# Patient Record
Sex: Female | Born: 1985 | Race: Black or African American | Hispanic: No | Marital: Single | Smoking: Never smoker
Health system: Southern US, Community
[De-identification: ages and names within clinical notes are randomized; demographics above are authoritative.]

## PROBLEM LIST (undated history)

## (undated) ENCOUNTER — Inpatient Hospital Stay (HOSPITAL_COMMUNITY): Payer: Self-pay

## (undated) DIAGNOSIS — I499 Cardiac arrhythmia, unspecified: Secondary | ICD-10-CM

## (undated) DIAGNOSIS — O26899 Other specified pregnancy related conditions, unspecified trimester: Secondary | ICD-10-CM

## (undated) DIAGNOSIS — R51 Headache: Secondary | ICD-10-CM

## (undated) DIAGNOSIS — D649 Anemia, unspecified: Secondary | ICD-10-CM

## (undated) DIAGNOSIS — R12 Heartburn: Secondary | ICD-10-CM

## (undated) DIAGNOSIS — R519 Headache, unspecified: Secondary | ICD-10-CM

## (undated) DIAGNOSIS — D219 Benign neoplasm of connective and other soft tissue, unspecified: Secondary | ICD-10-CM

---
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
## 2003-05-14 ENCOUNTER — Emergency Department (HOSPITAL_COMMUNITY): Admission: EM | Admit: 2003-05-14 | Discharge: 2003-05-15 | Payer: Self-pay | Admitting: *Deleted

## 2003-05-17 ENCOUNTER — Emergency Department (HOSPITAL_COMMUNITY): Admission: EM | Admit: 2003-05-17 | Discharge: 2003-05-17 | Payer: Self-pay | Admitting: Emergency Medicine

## 2006-11-19 ENCOUNTER — Emergency Department (HOSPITAL_COMMUNITY): Admission: EM | Admit: 2006-11-19 | Discharge: 2006-11-19 | Payer: Self-pay | Admitting: Emergency Medicine

## 2006-12-20 ENCOUNTER — Ambulatory Visit (HOSPITAL_COMMUNITY): Admission: RE | Admit: 2006-12-20 | Discharge: 2006-12-20 | Payer: Self-pay | Admitting: Obstetrics & Gynecology

## 2007-03-22 ENCOUNTER — Ambulatory Visit (HOSPITAL_COMMUNITY): Admission: RE | Admit: 2007-03-22 | Discharge: 2007-03-22 | Payer: Self-pay | Admitting: Obstetrics & Gynecology

## 2007-05-27 ENCOUNTER — Ambulatory Visit (HOSPITAL_COMMUNITY): Admission: RE | Admit: 2007-05-27 | Discharge: 2007-05-27 | Payer: Self-pay | Admitting: Obstetrics & Gynecology

## 2007-05-30 ENCOUNTER — Inpatient Hospital Stay (HOSPITAL_COMMUNITY): Admission: RE | Admit: 2007-05-30 | Discharge: 2007-06-02 | Payer: Self-pay | Admitting: Obstetrics & Gynecology

## 2010-11-08 NOTE — H&P (Signed)
Cynthia Tran, SEIER               ACCOUNT NO.:  [id number]   MEDICAL RECORD NO.:  [id number]          PATIENT TYPE:  INP   LOCATION:  NA                            FACILITY:  [facility]   PHYSICIAN:  Roseanna Rainbow, M.D.DATE OF BIRTH:  [birth year]   DATE OF ADMISSION:  DATE OF DISCHARGE:                              HISTORY & PHYSICAL   CHIEF COMPLAINT:  The patient is a 25 year old para 1 with an estimated  date of confinement of May 24, 2007 with an intrauterine pregnancy  at 40+ weeks, with the history of a previous cesarean delivery for an  elective repeat cesarean delivery.   HISTORY OF PRESENT ILLNESS:  Please see the above.   ALLERGIES:  No known drug allergies.   MEDICATIONS:  See please see the medication reconciliation form.   OBSTETRICAL RISK FACTORS:  1. Uterine fibroids.  2. Positive chlamydia DNA probe.  3. Urinary tract infection.  4. Previous cesarean section.   PAST OBSTETRICAL HISTORY:  In May of 2002, she was delivered of a live  born female, weight 7 pounds, at 42 weeks, via cesarean delivery.   PRENATAL SCREENINGS:  Chlamydia probe positive in June of 2008.  Test of  cure negative in September of 2008.  Urine culture and sensitivity in  June 2008 - E-coli; August 2008 insignificant growth and September  insignificant growth.  Platelet count 222,000.  RPR nonreactive, rubella  immune, sickle cell negative.  Blood type AB positive.  Antibody screen  negative.  GC probe negative.  One-hour GCT 92.  Hepatitis B surface  antigen negative.  Hematocrit 30.2, hemoglobin 10.3.  HIV nonreactive.  Sickle cell negative.  An ultrasound performed on March 22, 2007 at  31 weeks - estimated fetal weight percentile 34th percentile, normal  amniotic fluid volume, posterior placenta, no previa.   PAST GYNECOLOGIC HISTORY:  Noncontributory.   PAST MEDICAL HISTORY:  No significant history of medical diseases.   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   Does not give any  significant history of alcohol usage.  Has no significant smoking  history.   FAMILY HISTORY:  Hypertension.   PHYSICAL EXAMINATION:  VITAL SIGNS:  Stable, afebrile.  GENERAL:  Well-developed, well-nourished, in no apparent distress.  HEENT:  Normocephalic, atraumatic.  NECK:  Supple.  LUNGS:  Clear to auscultation bilaterally.  HEART:  Regular rate and rhythm.  ABDOMEN:  Gravid, nontender.  PELVIC:  Deferred.  EXTREMITIES:  Trace lower extremity edema.  SKIN:  Without rash.   ASSESSMENT:  Intrauterine pregnancy at term with a history of a previous  cesarean delivery.  Declines trial of labor.   PLAN:  The plan is admission and an elective repeat cesarean delivery.  The risks, benefits and alternative forms of management were reviewed  with the patient and informed consent had been obtained.      Roseanna Rainbow, M.D.  Electronically Signed     LAJ/MEDQ  D:  05/29/2007  T:  05/30/2007  Job:  [id number]

## 2010-11-08 NOTE — Op Note (Signed)
Cynthia Tran, Cynthia Tran               ACCOUNT NO.:  [id number]   MEDICAL RECORD NO.:  [id number]          PATIENT TYPE:  INP   LOCATION:  9116                          [facility]ACILITY:  [facility]   PHYSICIAN:  Roseanna Rainbow, M.D.DATE OF BIRTH:  01/18/1986   DATE OF PROCEDURE:  05/30/2007  DATE OF DISCHARGE:                               OPERATIVE REPORT   PREOPERATIVE DIAGNOSES:  1. Intrauterine pregnancy at term.  2. History of previous cesarean delivery.   POSTOPERATIVE DIAGNOSES:  1. Intrauterine pregnancy at term.  2. History of previous cesarean delivery.   PROCEDURE:  Repeat lower uterine flap elliptical cesarean delivery.   SURGEON:  Roseanna Rainbow, M.D.   ANESTHESIA:  Spinal.   ESTIMATED BLOOD LOSS:  600 mL.   COMPLICATIONS:  None.   PROCEDURE:  The patient was taken to the operating room with an IV  running.  A spinal anesthetic was then administered.  She was then  placed in the dorsal supine position with a leftward tilt and prepped  and draped in the usual sterile fashion.  After a time-out had been  completed, a Pfannenstiel skin incision was then made through the  previous scar and carried down to the underlying fascia.  The fascia was  nicked in the midline.  The fascial incision was then extended  bilaterally.  The superior aspect of the fascial incision was tented up  with Kocher clamps and the underlying rectus muscles dissected off.  The  inferior aspect of the fascial incision was manipulated in a similar  fashion.  The rectus muscles were separated in the midline.  The  parietal peritoneum was tented up and entered sharply.  This incision  was then extended superiorly and inferiorly with good visualization of  the bladder.  The bladder blade was then placed.  The vesicouterine  peritoneum was tented up and entered sharply.  This incision was then  extended bilaterally and the bladder flap created sharply.  The bladder  blade was then placed.  The lower  uterine segment was then incised in a  transverse fashion with the scalpel.  This incision was then extended  bluntly.  The infant's head was delivered atraumatically.  The cord was  clamped and cut.  The infant was handed off to the awaiting  neonatologist.  The placenta was then removed.  The intrauterine cavity  was evacuated of any remaining amniotic fluid, clots and debris with a  moist laparotomy sponge.  The cervix was then dilated.  The uterine  incision was then reapproximated in a running interlocking fashion with  0 Monocryl.  A second imbricating layer of the same was then placed.  The paracolic gutters were then irrigated.  The parietal peritoneum was  reapproximated in a running fashion using 2-0 Vicryl.  The fascia was  reapproximated in a running fashion using 0 PDS.  The skin was  closed in a subcuticular fashion using 3-0 Monocryl.  At the close of  the procedure, the instrument and pack counts were said to be correct  x2.  A gram of cephazolin had been given at cord clamp.  The patient was  taken to the PACU awake and in stable condition.      Roseanna Rainbow, M.D.  Electronically Signed     LAJ/MEDQ  D:  05/30/2007  T:  05/30/2007  Job:  [id number]

## 2010-11-11 NOTE — Discharge Summary (Signed)
NAMEKATRIEL, Cynthia Tran               ACCOUNT NO.:  [id number]   MEDICAL RECORD NO.:  [id number]          PATIENT TYPE:  INP   LOCATION:  9116                          [facility]ILITY[facility]  [facility]   PHYSICIAN:  Roseanna Rainbow, M.D.DATE OF BIRTH:  [birth year]   DATE OF ADMISSION:  05/30/2007  DATE OF DISCHARGE:  06/02/2007                               DISCHARGE SUMMARY   The patient is a 25 year old para 1 with an estimated date of  confinement of 05/24/2007 with an intrauterine pregnancy of 40+ weeks  with history of a previous cesarean delivery for an elective repeat  cesarean delivery.  Please see the dictated history and physical.   HOSPITAL COURSE:  The patient was admitted and underwent a repeat  cesarean delivery.  Please see the dictated operative summary.  On  postoperative day 1, her hemoglobin was 8.6 which was down from 9.8  preoperatively.  The remainder of her hospital course was uneventful.  She was discharged to home on postoperative day 3.   DISCHARGE DIAGNOSIS:  1. Intrauterine pregnancy at term.  2. History of previous cesarean delivery.   PROCEDURE:  Repeat cesarean delivery.   CONDITION:  Good.   DIET:  Regular.   ACTIVITY:  Pelvic rest.  Progressive activity.   MEDICATIONS:  Included Percocet, ibuprofen, Micronor.   DISPOSITION:  The patient was to follow up in the office in 2 weeks.      Roseanna Rainbow, M.D.  Electronically Signed     LAJ/MEDQ  D:  06/20/2007  T:  06/20/2007  Job:  [id number]

## 2010-11-11 NOTE — Discharge Summary (Signed)
[facility]  Patient:    Cynthia Tran, Cynthia Tran                        MRN: [id number] Adm. Date:  11/02/00 Disc. Date: 11/06/00 Dictator:   Jamey Reas, M.D.                           Discharge Summary  DATE OF BIRTH:                Mar 19, 1986  ADMISSION DIAGNOSES:          Term intrauterine pregnancy, active labor.  DISCHARGE DIAGNOSES:          Term intrauterine pregnancy, active labor, status post low transverse cesarean section for arrest of descent, persistent occiput posterior position, meconium.  CONSULTS:                     None.  PROCEDURE:                    Primary low transverse cesarean section on Nov 03, 2000 performed by Dr. Coral Ceo.  HOSPITAL COURSE:              A 25 year old G1, P1-0-0-1 at 42 6/7 admitted in active labor noted to have thick green meconium on artificial rupture of membranes.  Patient had failure to descend after two hours of pushing, persistent OP positioning.  Proceeded to cesarean section.  Patient had primary low transverse cesarean section of a viable female with Apgars of 5 at one minute and 9 at five minutes.  Patient did well postoperatively.  Routine postoperative course.  She is breast-feeding well.  She requests Depo at the time of discharge.  DISPOSITION:                  Discharged to home.  MEDICATIONS:                  Prenatal vitamins, Motrin, Percocet, iron sulfate.  DISCHARGE INSTRUCTIONS:       Routine postoperative instructions.  FOLLOW-UP:                    [place] [facility] in six weeks as routine followup. DD:  11/06/00 TD:  11/06/00 Job: [id number]

## 2010-11-11 NOTE — Op Note (Signed)
[facility]  Patient:    Cynthia Tran, Cynthia Tran                      MRN: [id number] Proc. Date: 11/03/00 Adm. Date:  [id number] Attending:  Antionette Char                           Operative Report  PREOPERATIVE DIAGNOSES:       1. Arrest of descent.                               2. Persistent occiput posterior position.                               3. Meconium.  POSTOPERATIVE DIAGNOSES:      1. Arrest of descent.                               2. Persistent occiput posterior position.                               3. Meconium.  PROCEDURE:                    Primary low transverse cesarean section.  SURGEON:                      Charles A. Clearance Coots, M.D.  ASSISTANT:                    Lysle Pearl, O.R.T.  ANESTHESIA:                   Epidural.  ESTIMATED BLOOD LOSS:         800 ml.  IV FLUIDS:                    400 ml.  URINE OUTPUT:                 100 ml clear.  COMPLICATIONS:                None.  DRAINS:                       Foley to gravity.  FINDINGS:                     A viable female at 50.  Apgars of 5 at one minute and 9 at five minutes.  Weight 7 lb.  Cord pH 7.24.  Normal uterus, ovaries and fallopian tubes.  DESCRIPTION OF PROCEDURE:     The patient was brought to the operating room after satisfactory redosing of the epidural.  The abdomen was prepped and draped in the usual sterile fashion.  A Pfannenstiel skin incision was made with a scalpel.  It was deepened down to the fascia with the scalpel.  The fascia was nicked in the midline and the facial incision was extended to the left and to the right with curved Mayo scissors.  The superior and inferiorly fascial edges were taken off of the rectus muscles with both blunt and sharp dissection.  The rectus muscle was then divided in the midline  superiorly and inferiorly, being careful to avoid the urinary bladder.  The peritoneum was entered digitally and was digitally  extended to the left and to the right.  A bladder blade was positioned.  The vesicouterine fold of the peritoneum above the reflection of the urinary bladder was grasped with forceps and was incised and undermined with Metzenbaum scissors.  The incision was extended to the left and to the right with Metzenbaum scissors.  A bladder flap was bluntly developed and the bladder blade was repositioned in front of the urinary bladder, placing it well out of the operative field.  The uterus was then entered transversely in the lower uterine segment with a scalpel.  A moderate amount of meconium-stained fluid was expelled.  The uterine incision was then extended laterally with digital traction.  The vertex was then delivered with the aid of fundal pressure from the assistant.  The infants mouth and nose were suctioned with DeLee suction and the delivery was then completed with the aid of fundal pressure from the assistant.  The umbilical cord was doubly clamped and cut and the infant was handed off to the nursery staff.  Cord pH and cord blood were obtained and the placenta was spontaneously expelled from the uterine cavity intact.  The uterus was exteriorized and the endometrial surface was thoroughly debrided with a dry laparotomy sponge.  The edges of the uterine incision were grasped with ring forceps and the uterus was closed with continuous interlocking suture of 0 Monocryl from each corner to the center.  Hemostasis was excellent.  The uterus was placed back in its normal anatomic position.  The pelvic cavity was thoroughly irrigated with warm saline solution.  All clots were removed.  Closure of the uterus was again observed for hemostasis and there was no active bleeding noted.  The abdomen was then closed as follows.  The fascia was closed with a continuous suture of 0 Panacryl.  The subcutaneous tissue was thoroughly irrigated with warm saline solution.  All areas of subcutaneous bleeding  were coagulated with the Bovie. The skin was then approximated with surgical stainless steel staples.  A sterile pressure bandage was applied to the incision closure.  The surgical technician indicated that all sponge, needle and instrument counts were correct.  The patient tolerated the procedure well and was transported to the recovery room in satisfactory condition. DD:  11/03/00 TD:  11/04/00 Job: [id number]

## 2010-11-11 NOTE — Discharge Summary (Signed)
[facility]  Patient:    Cynthia Tran, Cynthia Tran                        MRN: [id number] Adm. Date:  09/04/00 Disc. Date: 09/06/00 Dictator:   Marta Lamas, M.D.                           Discharge Summary  ADMISSION DIAGNOSES:          1. Intrauterine pregnancy at 33 weeks.                               2. Preterm contractions.                               3. Group B Streptococcal infection.  DISCHARGE DIAGNOSES:          1. Intrauterine pregnancy at 33 weeks.                               2. Preterm contractions.                               3. Group B Streptococcal infection.  PROCEDURE:                    None.  CONSULTING PHYSICIANS:        Social work consult.  HISTORY OF PRESENT ILLNESS:   The patient was called at home for gallbladder positive results and was told to come into triage for evaluation.  Upon arrival to triage, the patient had uterine contractions noted on the tocometer. Thus, the patient was evaluated and admitted for preter contractions with GBS positive results.  See H&P for details.  HOSPITAL COURSE:              Upon admission, the patient was started on magnesium sulfate for her uterine contractions.  Also the patient was started on Unasyn 3 grams IV q.6h.  Further, the patient was given betamethasone 12.5 mg IM x 2 doses for prematurity.  At the time of evaluation, the patient was also evaluated for other infection including GC and Chlamydia and cultures were sent and were negative during this hospital course.  Upon admission as well, the patients cervix was noted to be closed, thick, and high.  However, the patient continued to have uterine contractions.  The fetus had a reactive NST at the time.  The patient was started on magnesium sulfate after which her uterine contractions resolved.  Upon reexamination of cervix, the patients cervix was unchanged.  At that time magnesium was discontinued and she was started on oral  Terbutaline  and IV bolus of fluids. However on hospital day #2, the patients contractions increased in frequency and thus the patients Terbutaline was stopped and her magnesium sulfate was restarted.  However, at that time the patient had no cervical change from examination on admission.  After magnesium was started the second time, the patients uterine contractions resolved.  On hospital day #3, the patients magnesium was discontinued after no uterine contractions were noted.  Since discontinuation of the magnesium, the patient has had no other uterine activity.  Further there was concern for intrauterine growth retardation of  the fetus noted on the patients last ultrasound on August 14, 2000.  The ultrasound noted that the fetus was measuring in the 15th percentile and less than 10th percentile in weight using the Shephard and Hatlock criteria at 30 weeks. Ultrasound was repeated today and the fetal weight was within normal limits for gestational age.  The cervical length was also noted to be 3.5 cm which helped rule out preterm labor.  Thus, since the patient has had no further uterine activity and no cervical change during this hospital course and further, no signs of infection, we will discharge the patient home on modified bed rest.  We will continue the patient on ampicillin 500 mg four times a day for the next five days to have a total of seven days of antibiotics for her GBS positive results.  Further throughout hospital course, the fetus remained active during monitoring and further ultrasound proves good growth.  DISCHARGE MEDICATIONS:        Ampicillin 500 mg one tablet p.o. q.i.d. x 5 days.  DISCHARGE INSTRUCTIONS:       The patient is to be on modified bed rest and pelvic rest for the rest of the pregnancy.  FOLLOW-UP:                    The patient is to follow up at [facility] on March 21, at 8:30 p.m.  CONDITION ON DISCHARGE:       Stable.  DISPOSITION:                   Home with mother. DD:  09/06/00 TD:  09/06/00 Job: [id number]

## 2011-04-03 LAB — CBC
HCT: 24.4 — ABNORMAL LOW
HCT: 28.4 — ABNORMAL LOW
Hemoglobin: 8.6 — ABNORMAL LOW
MCHC: 35.2
MCV: 93.5
RBC: 3.03 — ABNORMAL LOW
RDW: 13
WBC: 8.4

## 2011-04-03 LAB — RPR: RPR Ser Ql: NONREACTIVE

## 2013-02-25 ENCOUNTER — Encounter (HOSPITAL_COMMUNITY): Payer: Self-pay | Admitting: Family Medicine

## 2013-02-25 ENCOUNTER — Emergency Department (HOSPITAL_COMMUNITY)
Admission: EM | Admit: 2013-02-25 | Discharge: 2013-02-26 | Disposition: A | Discharge status: Home / Self Care | Payer: Medicaid Other | Attending: Emergency Medicine | Admitting: Emergency Medicine

## 2013-02-25 DIAGNOSIS — R51 Headache: Secondary | ICD-10-CM | POA: Insufficient documentation

## 2013-02-25 DIAGNOSIS — H9209 Otalgia, unspecified ear: Secondary | ICD-10-CM | POA: Insufficient documentation

## 2013-02-25 DIAGNOSIS — K029 Dental caries, unspecified: Secondary | ICD-10-CM | POA: Insufficient documentation

## 2013-02-25 NOTE — ED Notes (Signed)
Patient states she has had a toothache since last Sunday. Has been using Orajel and it helped some with the tooth pain; now states she has right ear pain. Denies ear drainage or changes in hearing.

## 2013-02-26 MED ORDER — TRAMADOL HCL 50 MG PO TABS
50.0000 mg | ORAL_TABLET | Freq: Once | ORAL | Status: AC
  Administered 2013-02-26: 50 mg via ORAL
  Filled 2013-02-26: qty 1

## 2013-02-26 MED ORDER — TRAMADOL HCL 50 MG PO TABS: 50.0000 mg | ORAL_TABLET | Freq: Four times a day (QID) | ORAL | Status: DC | PRN

## 2013-02-26 MED ORDER — PENICILLIN V POTASSIUM 500 MG PO TABS: 500.0000 mg | ORAL_TABLET | Freq: Four times a day (QID) | ORAL | Status: DC

## 2013-02-26 MED ORDER — PENICILLIN V POTASSIUM 500 MG PO TABS
500.0000 mg | ORAL_TABLET | Freq: Once | ORAL | Status: AC
  Administered 2013-02-26: 500 mg via ORAL
  Filled 2013-02-26: qty 1

## 2013-02-26 NOTE — ED Provider Notes (Signed)
CSN: [id number]     Arrival date & time 02/25/13  2111 History   First MD Initiated Contact with Patient 02/26/13 0118     Chief Complaint  Patient presents with  . Otalgia  . Dental Pain   (Consider location/radiation/quality/duration/timing/severity/associated sxs/prior Treatment) HPI Comments: Patient reports, that she's had intermittent, right, ear pain, as well as a large cavity in her right lower molar intermittently.  It has bothered her for the past several months, worse over the last 3, days.  She's taken over-the-counter ibuprofen, applied.  Warm compresses, without relief of her discomfort.  She denies any fever, discharge from her ear   Patient is a 27 y.o. female presenting with ear pain and tooth pain. The history is provided by the patient.  Otalgia Location:  Right Quality:  Aching Severity:  No pain Onset quality:  Gradual Timing:  Intermittent Progression:  Worsening Chronicity:  Recurrent Context comment:  Large cavity, right lower second molar Relieved by:  Nothing Worsened by:  Cold air Ineffective treatments:  OTC medications Associated symptoms: headaches   Associated symptoms: no ear discharge, no fever, no hearing loss, no neck pain, no rash, no rhinorrhea, no sore throat and no tinnitus   Dental Pain Associated symptoms: headaches   Associated symptoms: no fever and no neck pain     History reviewed. No pertinent past medical history. Past Surgical History  Procedure Laterality Date  . Cesarean section     No family history on file. History  Substance Use Topics  . Smoking status: Never Smoker   . Smokeless tobacco: Not on file  . Alcohol Use: Yes     Comment: Occasional    OB History   Grav Para Term Preterm Abortions TAB SAB Ect Mult Living                 Review of Systems  Constitutional: Negative for fever.  HENT: Positive for ear pain. Negative for hearing loss, sore throat, rhinorrhea, neck pain, tinnitus and ear discharge.   Skin:  Negative for rash.  Neurological: Positive for headaches.  All other systems reviewed and are negative.    Allergies  Review of patient's allergies indicates no known allergies.  Home Medications   Current Outpatient Rx  Name  Route  Sig  Dispense  Refill  . benzocaine (ORAJEL) 10 % mucosal gel   Mouth/Throat   Use as directed in the mouth or throat as needed for pain.         . diphenhydramine-acetaminophen (TYLENOL PM) 25-500 MG TABS   Oral   Take 1 tablet by mouth at bedtime as needed.         . penicillin v potassium (VEETID) 500 MG tablet   Oral   Take 1 tablet (500 mg total) by mouth 4 (four) times daily.   39 tablet   0   . traMADol (ULTRAM) 50 MG tablet   Oral   Take 1 tablet (50 mg total) by mouth every 6 (six) hours as needed for pain.   30 tablet   0    BP 148/82  Pulse 93  Temp(Src) 98.5 F (36.9 C) (Oral)  Resp 16  SpO2 100%  LMP 02/16/2013 Physical Exam  Nursing note and vitals reviewed. Constitutional: She appears well-developed and well-nourished.  HENT:  Head: Normocephalic and atraumatic.  Right Ear: External ear normal.  Left Ear: External ear normal.  Mouth/Throat:    Eyes: Pupils are equal, round, and reactive to light.  Neck: Normal  range of motion.  Cardiovascular: Normal rate and regular rhythm.   Pulmonary/Chest: Effort normal and breath sounds normal.  Musculoskeletal: Normal range of motion.  Lymphadenopathy:    She has no cervical adenopathy.  Neurological: She is alert.  Skin: Skin is warm and dry.    ED Course  Procedures (including critical care time) Labs Review Labs Reviewed - No data to display Imaging Review No results found.  MDM   1. Dental caries     Patient will be treated with Pen-Vee K Ultram.  She has been referred to the free dental clinic, which revealed to [facility], September 26 and 27th    Arman Filter, NP 02/26/13 0145

## 2013-02-26 NOTE — ED Provider Notes (Signed)
Medical screening examination/treatment/procedure(s) were performed by non-physician practitioner and as supervising physician I was immediately available for consultation/collaboration.   Suzi Roots, MD 02/26/13 [phone]

## 2013-03-04 ENCOUNTER — Emergency Department (HOSPITAL_COMMUNITY)
Admission: EM | Admit: 2013-03-04 | Discharge: 2013-03-04 | Disposition: A | Discharge status: Home / Self Care | Payer: Medicaid Other | Attending: Emergency Medicine | Admitting: Emergency Medicine

## 2013-03-04 ENCOUNTER — Encounter (HOSPITAL_COMMUNITY): Payer: Self-pay | Admitting: Emergency Medicine

## 2013-03-04 DIAGNOSIS — K0889 Other specified disorders of teeth and supporting structures: Secondary | ICD-10-CM

## 2013-03-04 DIAGNOSIS — K089 Disorder of teeth and supporting structures, unspecified: Secondary | ICD-10-CM | POA: Insufficient documentation

## 2013-03-04 DIAGNOSIS — Z79899 Other long term (current) drug therapy: Secondary | ICD-10-CM | POA: Insufficient documentation

## 2013-03-04 MED ORDER — HYDROCODONE-ACETAMINOPHEN 5-325 MG PO TABS: 1.0000 | ORAL_TABLET | Freq: Four times a day (QID) | ORAL | Status: DC | PRN

## 2013-03-04 NOTE — ED Provider Notes (Signed)
CSN: [id number]     Arrival date & time 03/04/13  1622 History  This chart was scribed for non-physician practitioner Roxy Horseman, PA-C working with Gavin Pound. Oletta Lamas, MD by Danella Maiers, ED Scribe. This patient was seen in room TR05C/TR05C and the patient's care was started at 4:33 PM.    Chief Complaint  Patient presents with  . Dental Pain   The history is provided by the patient. No language interpreter was used.   HPI Comments: Cynthia Tran is a 27 y.o. female who presents to the Emergency Department complaining of constant bilateral dental pain onset 2 and a half weeks ago. She has right upper jaw pain that radiates to her temple and a broken tooth on her left upper jaw. She was seen on 02/25/13 for the right upper jaw pain and told she had an infection. She was given antibiotics and pain meds. She is still taking the antibiotics but is not taking the pain meds because she said they do not help and they make her stomach hurt. She reports inability to sleep and eat because of the pain. She does not have dental insurance.    History reviewed. No pertinent past medical history. Past Surgical History  Procedure Laterality Date  . Cesarean section     History reviewed. No pertinent family history. History  Substance Use Topics  . Smoking status: Never Smoker   . Smokeless tobacco: Not on file  . Alcohol Use: Yes     Comment: Occasional    OB History   Grav Para Term Preterm Abortions TAB SAB Ect Mult Living                 Review of Systems  All other systems reviewed and are negative.    Allergies  Review of patient's allergies indicates no known allergies.  Home Medications   Current Outpatient Rx  Name  Route  Sig  Dispense  Refill  . benzocaine (ORAJEL) 10 % mucosal gel   Mouth/Throat   Use as directed in the mouth or throat as needed for pain.         . diphenhydramine-acetaminophen (TYLENOL PM) 25-500 MG TABS   Oral   Take 1 tablet by mouth at bedtime as  needed.         . penicillin v potassium (VEETID) 500 MG tablet   Oral   Take 1 tablet (500 mg total) by mouth 4 (four) times daily.   39 tablet   0   . traMADol (ULTRAM) 50 MG tablet   Oral   Take 1 tablet (50 mg total) by mouth every 6 (six) hours as needed for pain.   30 tablet   0    BP 147/80  Pulse 86  Temp(Src) 98.2 F (36.8 C) (Oral)  Resp 18  SpO2 100%  LMP 02/16/2013 Physical Exam  Nursing note and vitals reviewed. Constitutional: She is oriented to person, place, and time. She appears well-developed and well-nourished. No distress.  HENT:  Head: Normocephalic and atraumatic.  Mouth/Throat:    Poor dentition throughout.  Affected tooth as diagrammed.  No signs of peritonsillar or tonsillar abscess.  No signs of gingival abscess. Oropharynx is clear and without exudates.  Uvula is midline.  Airway is intact. No signs of Ludwig's angina.   Eyes: EOM are normal.  Neck: Neck supple. No tracheal deviation present.  Cardiovascular: Normal rate.   Pulmonary/Chest: Effort normal. No respiratory distress.  Musculoskeletal: Normal range of motion.  Neurological: She  is alert and oriented to person, place, and time.  Skin: Skin is warm and dry.  Psychiatric: She has a normal mood and affect. Her behavior is normal.    ED Course  Dental Date/Time: 03/04/2013 4:59 PM Performed by: Roxy Horseman Authorized by: Roxy Horseman Consent: Verbal consent obtained. Risks and benefits: risks, benefits and alternatives were discussed Consent given by: patient Patient understanding: patient states understanding of the procedure being performed Patient consent: the patient's understanding of the procedure matches consent given Procedure consent: procedure consent matches procedure scheduled Relevant documents: relevant documents present and verified Test results: test results available and properly labeled Site marked: the operative site was marked Imaging studies:  imaging studies available Required items: required blood products, implants, devices, and special equipment available Patient identity confirmed: verbally with patient Time out: Immediately prior to procedure a "time out" was called to verify the correct patient, procedure, equipment, support staff and site/side marked as required. Preparation: Patient was prepped and draped in the usual sterile fashion. Local anesthesia used: yes Anesthesia: local infiltration Local anesthetic: bupivacaine 0.25% with epinephrine Anesthetic total: 1.8 ml Patient sedated: no Patient tolerance: Patient tolerated the procedure well with no immediate complications.   (including critical care time) Medications - No data to display  DIAGNOSTIC STUDIES: Oxygen Saturation is 100% on room air, normal by my interpretation.    COORDINATION OF CARE: 4:51 PM- Discussed treatment plan with pt which includes a numbing shot and a dentist referral and pt agrees to plan.    Labs Review Labs Reviewed - No data to display Imaging Review No results found.  MDM   1. Pain, dental    Patient with toothache.  No gross abscess.  Exam unconcerning for Ludwig's angina or spread of infection.  Will treat with penicillin and pain medicine.  Urged patient to follow-up with dentist.    I personally performed the services described in this documentation, which was scribed in my presence. The recorded information has been reviewed and is accurate.           Roxy Horseman, PA-C 03/04/13 1700

## 2013-03-04 NOTE — ED Notes (Signed)
Pt c/o bilateral dental pain

## 2013-03-06 NOTE — ED Provider Notes (Signed)
Medical screening examination/treatment/procedure(s) were performed by non-physician practitioner and as supervising physician I was immediately available for consultation/collaboration.  Gavin Pound. Oletta Lamas, MD 03/06/13 2157

## 2013-03-16 ENCOUNTER — Emergency Department (HOSPITAL_COMMUNITY)
Admission: EM | Admit: 2013-03-16 | Discharge: 2013-03-16 | Disposition: A | Discharge status: Home / Self Care | Payer: Medicaid Other | Attending: Emergency Medicine | Admitting: Emergency Medicine

## 2013-03-16 ENCOUNTER — Encounter (HOSPITAL_COMMUNITY): Payer: Self-pay | Admitting: Emergency Medicine

## 2013-03-16 DIAGNOSIS — K089 Disorder of teeth and supporting structures, unspecified: Secondary | ICD-10-CM | POA: Insufficient documentation

## 2013-03-16 DIAGNOSIS — K029 Dental caries, unspecified: Secondary | ICD-10-CM

## 2013-03-16 MED ORDER — IBUPROFEN 600 MG PO TABS: 600.0000 mg | ORAL_TABLET | Freq: Four times a day (QID) | ORAL | Status: DC | PRN

## 2013-03-16 MED ORDER — HYDROCODONE-ACETAMINOPHEN 5-325 MG PO TABS
2.0000 | ORAL_TABLET | Freq: Once | ORAL | Status: AC
  Administered 2013-03-16: 2 via ORAL
  Filled 2013-03-16: qty 2

## 2013-03-16 MED ORDER — HYDROCODONE-ACETAMINOPHEN 5-325 MG PO TABS: ORAL_TABLET | ORAL | Status: DC

## 2013-03-16 MED ORDER — CLINDAMYCIN HCL 150 MG PO CAPS: 300.0000 mg | ORAL_CAPSULE | Freq: Three times a day (TID) | ORAL | Status: DC

## 2013-03-16 NOTE — ED Notes (Signed)
Pt c/o right ear pain x 3 days

## 2013-03-16 NOTE — ED Provider Notes (Signed)
Medical screening examination/treatment/procedure(s) were performed by non-physician practitioner and as supervising physician I was immediately available for consultation/collaboration.   Shanna Cisco, MD 03/16/13 [phone]

## 2013-03-16 NOTE — ED Provider Notes (Signed)
CSN: [id number]     Arrival date & time 03/16/13  1053 History  This chart was scribed for non-physician practitioner working Junius Finner, PA-C, with Shanna Cisco, MD by Dorothey Baseman, ED Scribe. This patient was seen in room TR09C/TR09C and the patient's care was started at 12:11 PM.    Chief Complaint  Patient presents with  . Otalgia   The history is provided by the patient. No language interpreter was used.   HPI Comments: Cynthia Tran is a 27 y.o. female who presents to the Emergency Department complaining of a shooting right ear pain that radiates into the right jaw with some associated tongue swelling onset 1 week ago. She states that the ear pain appeared to be getting better last week, but has since been progressively worsening. She states that she was seen here for dental problems about 1 month ago and was given Penicillin and she reports that she finished the course 9 days ago. She reports that she has taken Tylenol PM and Vicodin at home without relief. She denies fever, nausea, vomiting, difficulty swallowing, or difficulty breathing. Patient reports that she was referred to a dentist, but has not been able to follow up.  History reviewed. No pertinent past medical history. Past Surgical History  Procedure Laterality Date  . Cesarean section     History reviewed. No pertinent family history. History  Substance Use Topics  . Smoking status: Never Smoker   . Smokeless tobacco: Not on file  . Alcohol Use: Yes     Comment: Occasional    OB History   Grav Para Term Preterm Abortions TAB SAB Ect Mult Living                 Review of Systems  Constitutional: Negative for fever.  HENT: Positive for ear pain and dental problem. Negative for trouble swallowing.   Respiratory: Negative for shortness of breath.   Gastrointestinal: Negative for nausea and vomiting.  All other systems reviewed and are negative.    Allergies  Percocet  Home Medications   Current  Outpatient Rx  Name  Route  Sig  Dispense  Refill  . clindamycin (CLEOCIN) 150 MG capsule   Oral   Take 2 capsules (300 mg total) by mouth 3 (three) times daily. May dispense as 150mg  capsules   60 capsule   0   . diphenhydramine-acetaminophen (TYLENOL PM) 25-500 MG TABS   Oral   Take 1 tablet by mouth at bedtime as needed. For pain/sleep         . HYDROcodone-acetaminophen (NORCO/VICODIN) 5-325 MG per tablet   Oral   Take 1 tablet by mouth every 6 (six) hours as needed for pain.   7 tablet   0   . HYDROcodone-acetaminophen (NORCO/VICODIN) 5-325 MG per tablet      Take 1-2 pills every 4-6 hours as needed for pain   6 tablet   0   . ibuprofen (ADVIL,MOTRIN) 600 MG tablet   Oral   Take 1 tablet (600 mg total) by mouth every 6 (six) hours as needed for pain.   30 tablet   0   . penicillin v potassium (VEETID) 500 MG tablet   Oral   Take 1 tablet (500 mg total) by mouth 4 (four) times daily.   39 tablet   0   . traMADol (ULTRAM) 50 MG tablet   Oral   Take 1 tablet (50 mg total) by mouth every 6 (six) hours as needed for pain.  30 tablet   0    Triage Vitals; BP 153/84  Pulse 91  Temp(Src) 98.2 F (36.8 C) (Oral)  Resp 18  Ht 5\' 2"  (1.575 m)  SpO2 100%  LMP 02/16/2013  Physical Exam  Nursing note and vitals reviewed. Constitutional: She is oriented to person, place, and time. She appears well-developed and well-nourished.  HENT:  Head: Normocephalic and atraumatic.  Right Ear: Hearing, tympanic membrane, external ear and ear canal normal.  Left Ear: Hearing, tympanic membrane, external ear and ear canal normal.  Mouth/Throat: Oropharynx is clear and moist.  No dental abscess appreciated.   Eyes: EOM are normal.  Neck: Normal range of motion.  Cardiovascular: Normal rate.   Pulmonary/Chest: Effort normal.  Musculoskeletal: Normal range of motion.  Neurological: She is alert and oriented to person, place, and time.  Skin: Skin is warm and dry.   Psychiatric: She has a normal mood and affect. Her behavior is normal.    ED Course  Procedures (including critical care time)  DIAGNOSTIC STUDIES: Oxygen Saturation is 100% on room air, normal by my interpretation.    COORDINATION OF CARE: 12:20PM- Will discharge patient with antibiotics and pain medication to manage pain symptoms. Advised patient to attend the free dental clinic. Advised patient to return to the ED if there are any new or worsening symptoms, especially difficulty swallowing or facial swelling. Discussed treatment plan with patient at bedside and patient verbalized agreement.     Labs Review Labs Reviewed - No data to display Imaging Review No results found.  MDM   1. Pain due to dental caries     I personally performed the services described in this documentation, which was scribed in my presence. The recorded information has been reviewed and is accurate.    Junius Finner, PA-C 03/16/13 [phone]

## 2013-06-26 DIAGNOSIS — I499 Cardiac arrhythmia, unspecified: Secondary | ICD-10-CM

## 2013-06-26 HISTORY — DX: Cardiac arrhythmia, unspecified: I49.9

## 2013-10-28 ENCOUNTER — Encounter (HOSPITAL_COMMUNITY): Payer: Self-pay | Admitting: *Deleted

## 2013-10-28 ENCOUNTER — Inpatient Hospital Stay (HOSPITAL_COMMUNITY)
Admission: AD | Admit: 2013-10-28 | Discharge: 2013-10-28 | Disposition: A | Discharge status: Home / Self Care | Payer: Self-pay | Source: Ambulatory Visit | Attending: Obstetrics & Gynecology | Admitting: Obstetrics & Gynecology

## 2013-10-28 DIAGNOSIS — Z3201 Encounter for pregnancy test, result positive: Secondary | ICD-10-CM

## 2013-10-28 HISTORY — DX: Benign neoplasm of connective and other soft tissue, unspecified: D21.9

## 2013-10-28 LAB — POCT PREGNANCY, URINE: PREG TEST UR: POSITIVE — AB

## 2013-10-28 NOTE — MAU Provider Note (Signed)
Attestation of Attending Supervision of Advanced Practitioner (CNM/NP): Evaluation and management procedures were performed by the Advanced Practitioner under my supervision and collaboration.  I have reviewed the Advanced Practitioner's note and chart, and I agree with the management and plan.  Billey Wojciak Harraway-Smith 4:02 PM

## 2013-10-28 NOTE — Discharge Instructions (Signed)
Prenatal Care  °WHAT IS PRENATAL CARE?  °Prenatal care means health care during your pregnancy, before your baby is born. It is very important to take care of yourself and your baby during your pregnancy by:  °· Getting early prenatal care. If you know you are pregnant, or think you might be pregnant, call your health care provider as soon as possible. Schedule a visit for a prenatal exam. °· Getting regular prenatal care. Follow your health care provider's schedule for blood and other necessary tests. Do not miss appointments. °· Doing everything you can to keep yourself and your baby healthy during your pregnancy. °· Getting complete care. Prenatal care should include evaluation of the medical, dietary, educational, psychological, and social needs of you and your significant other. The medical and genetic history of your family and the family of your baby's father should be discussed with your health care provider. °· Discussing with your health care provider: °· Prescription, over-the-counter, and herbal medicines that you take. °· Any history of substance abuse, alcohol use, smoking, and illegal drug use. °· Any history of domestic abuse and violence. °· Immunizations you have received. °· Your nutrition and diet. °· The amount of exercise you do. °· Any environmental and occupational hazards to which you are exposed. °· History of sexually transmitted infections for both you and your partner. °· Previous pregnancies you have had. °WHY IS PRENATAL CARE SO IMPORTANT?  °By regularly seeing your health care provider, you help ensure that problems can be identified early so that they can be treated as soon as possible. Other problems might be prevented. Many studies have shown that early and regular prenatal care is important for the health of mothers and their babies.  °HOW CAN I TAKE CARE OF MYSELF WHILE I AM PREGNANT?  °Here are ways to take care of yourself and your baby:  °· Start or continue taking your  multivitamin with 400 micrograms (mcg) of folic acid every day. °· Get early and regular prenatal care. It is very important to see a health care provider during your pregnancy. Your health care provider will check at each visit to make sure that you and the baby are healthy. If there are any problems, action can be taken right away to help you and the baby. °· Eat a healthy diet that includes: °· Fruits. °· Vegetables. °· Foods low in saturated fat. °· Whole grains. °· Calcium-rich foods, such as milk, yogurt, and hard cheeses. °· Drink 6 to 8 glasses of liquids a day. °· Unless your health care provider tells you not to, try to be physically active for 30 minutes, most days of the week. If you are pressed for time, you can get your activity in through 10-minute segments, three times a day. °· Do not smoke, drink alcohol, or use drugs. These can cause long-term damage to your baby. Talk with your health care provider about steps to take to stop smoking. Talk with a member of your faith community, a counselor, a trusted friend, or your health care provider if you are concerned about your alcohol or drug use. °· Ask your health care provider before taking any medicine, even over-the-counter medicines. Some medicines are not safe to take during pregnancy. °· Get plenty of rest and sleep. °· Avoid hot tubs and saunas during pregnancy. °· Do not have X-rays taken unless absolutely necessary and with the recommendation of your health care provider. A lead shield can be placed on your abdomen to protect the   baby when X-rays are taken in other parts of the body. °· Do not empty the cat litter when you are pregnant. It may contain a parasite that causes an infection called toxoplasmosis, which can cause birth defects. Also, use gloves when working in garden areas used by cats. °· Do not eat uncooked or undercooked meats or fish. °· Do not eat soft, mold-ripened cheeses (Brie, Camembert, and chevre) or soft, blue-veined  cheese (Danish blue and Roquefort). °· Stay away from toxic chemicals like: °· Insecticides. °· Solvents (some cleaners or paint thinners). °· Lead. °· Mercury. °· Sexual intercourse may continue until the end of the pregnancy, unless you have a medical problem or there is a problem with the pregnancy and your health care provider tells you not to. °· Do not wear high-heel shoes, especially during the second half of the pregnancy. You can lose your balance and fall. °· Do not take long trips, unless absolutely necessary. Be sure to see your health care provider before going on the trip. °· Do not sit in one position for more than 2 hours when on a trip. °· Take a copy of your medical records when going on a trip. Know where a hospital is located in the city you are visiting, in case of an emergency. °· Most dangerous household products will have pregnancy warnings on their labels. Ask your health care provider about products if you are unsure. °· Limit or eliminate your caffeine intake from coffee, tea, sodas, medicines, and chocolate. °· Many women continue working through pregnancy. Staying active might help you stay healthier. If you have a question about the safety or the hours you work at your particular job, talk with your health care provider. °· Get informed: °· Read books. °· Watch videos. °· Go to childbirth classes for you and your significant other. °· Talk with experienced moms. °· Ask your health care provider about childbirth education classes for you and your partner. Classes can help you and your partner prepare for the birth of your baby. °· Ask about a baby doctor (pediatrician) and methods and pain medicine for labor, delivery, and possible cesarean delivery. °HOW OFTEN SHOULD I SEE MY HEALTH CARE PROVIDER DURING PREGNANCY?  °Your health care provider will give you a schedule for your prenatal visits. You will have visits more often as you get closer to the end of your pregnancy. An average  pregnancy lasts about 40 weeks.  °A typical schedule includes visiting your health care provider:  °· About once each month during your first 6 months of pregnancy. °· Every 2 weeks during the next 2 months. °· Weekly in the last month, until the delivery date. °Your health care provider will probably want to see you more often if: °· You are older than 35 years. °· Your pregnancy is high risk because you have certain health problems or problems with the pregnancy, such as: °· Diabetes. °· High blood pressure. °· The baby is not growing on schedule, according to the dates of the pregnancy. °Your health care provider will do special tests to make sure you and the baby are not having any serious problems. °WHAT HAPPENS DURING PRENATAL VISITS?  °· At your first prenatal visit, your health care provider will do a physical exam and talk to you about your health history and the health history of your partner and your family. Your health care provider will be able to tell you what date to expect your baby to be born on. °·   Your first physical exam will include checks of your blood pressure, measurements of your height and weight, and an exam of your pelvic organs. Your health care provider will do a Pap test if you have not had one recently and will do cultures of your cervix to make sure there is no infection. °· At each prenatal visit, there will be tests of your blood, urine, blood pressure, weight, and checking the progress of the baby. °· At your later prenatal visits, your health care provider will check how you are doing and how the baby is developing. You may have a number of tests done as your pregnancy progresses. °· Ultrasound exams are often used to check on the baby's growth and health. °· You may have more urine and blood tests, as well as special tests, if needed. These may include amniocentesis to examine fluid in the pregnancy sac, stress tests to check how the baby responds to contractions, or a  biophysical profile to measure fetus well-being. Your health care provider will explain the tests and why they are necessary. °· You should discuss with your health care provider your plans to breastfeed or bottle-feed your baby. °· Each visit is also a chance for you to learn about staying healthy during pregnancy and to ask questions. °Document Released: 06/15/2003 Document Revised: 04/02/2013 Document Reviewed: 11/28/2012 °[employer]® Patient Information ©2014 [employer]. ° °

## 2013-10-28 NOTE — MAU Note (Signed)
+  HPT 2-3 wks ago.  Called dr to start care, was instructed to come here for confirmation and to get [facility]. No complaints or problems.

## 2013-10-28 NOTE — MAU Provider Note (Signed)
History     CSN: [id number]  Arrival date and time: 10/28/13 1314   First Provider Initiated Contact with Patient 10/28/13 1338      Chief Complaint  Patient presents with  . Possible Pregnancy   Possible Pregnancy    Pt is a G3P2002 at wks [redacted]w[redacted]d IUP by LMP here with +HPT 2-3 wks ago. Called MD to start care instructed to come here for confirmation and to get [facility]. No complaints or problems.   Past Medical History  Diagnosis Date  . Preterm labor     contractions, no dilation  . Fibroid     Past Surgical History  Procedure Laterality Date  . Cesarean section      Family History  Problem Relation Age of Onset  . Hypertension Mother   . Diabetes Maternal Grandmother   . Hypertension Maternal Grandmother   . Heart disease Maternal Grandmother     3 heart attacks    History  Substance Use Topics  . Smoking status: Never Smoker   . Smokeless tobacco: Never Used  . Alcohol Use: No     Comment: Occasional     Allergies:  Allergies  Allergen Reactions  . Percocet [Oxycodone-Acetaminophen] Other (See Comments)    Blisters on face    Prescriptions prior to admission  Medication Sig Dispense Refill  . clindamycin (CLEOCIN) 150 MG capsule Take 2 capsules (300 mg total) by mouth 3 (three) times daily. May dispense as 150mg  capsules  60 capsule  0  . diphenhydramine-acetaminophen (TYLENOL PM) 25-500 MG TABS Take 1 tablet by mouth at bedtime as needed. For pain/sleep      . HYDROcodone-acetaminophen (NORCO/VICODIN) 5-325 MG per tablet Take 1 tablet by mouth every 6 (six) hours as needed for pain.  7 tablet  0  . HYDROcodone-acetaminophen (NORCO/VICODIN) 5-325 MG per tablet Take 1-2 pills every 4-6 hours as needed for pain  6 tablet  0  . ibuprofen (ADVIL,MOTRIN) 600 MG tablet Take 1 tablet (600 mg total) by mouth every 6 (six) hours as needed for pain.  30 tablet  0  . penicillin v potassium (VEETID) 500 MG tablet Take 1 tablet (500 mg total) by mouth 4 (four) times  daily.  39 tablet  0  . traMADol (ULTRAM) 50 MG tablet Take 1 tablet (50 mg total) by mouth every 6 (six) hours as needed for pain.  30 tablet  0    Review of Systems  Constitutional:       Here for pregnancy test   Physical Exam   Blood pressure 135/65, pulse 91, temperature 98.6 F (37 C), temperature source Oral, resp. rate 16, weight 71.215 kg (157 lb), last menstrual period 08/27/2013.  Physical Exam  Constitutional: She is oriented to person, place, and time. She appears well-developed and well-nourished. No distress.  HENT:  Head: Normocephalic.  Neck: Normal range of motion. Neck supple.  Neurological: She is alert and oriented to person, place, and time. She has normal reflexes.  Skin: Skin is warm and dry.    MAU Course  Procedures Results for orders placed during the hospital encounter of 10/28/13 (from the past 24 hour(s))  POCT PREGNANCY, URINE     Status: Abnormal   Collection Time    10/28/13  1:41 PM      Result Value Ref Range   Preg Test, Ur POSITIVE (*) NEGATIVE    Assessment and Plan  28 yo G3P2002 at [redacted]w[redacted]d wks IUP Positive Pregnancy Test  Plan: Discharge to home Pregnancy  Confirmation given Given list of providers.    [place] 10/28/2013, 1:42 PM

## 2014-02-09 ENCOUNTER — Other Ambulatory Visit (HOSPITAL_COMMUNITY): Payer: Self-pay | Admitting: Nurse Practitioner

## 2014-02-09 DIAGNOSIS — Z3689 Encounter for other specified antenatal screening: Secondary | ICD-10-CM

## 2014-02-09 LAB — OB RESULTS CONSOLE RUBELLA ANTIBODY, IGM: RUBELLA: IMMUNE

## 2014-02-09 LAB — OB RESULTS CONSOLE HEPATITIS B SURFACE ANTIGEN: HEP B S AG: NEGATIVE

## 2014-02-09 LAB — OB RESULTS CONSOLE ABO/RH: RH Type: POSITIVE

## 2014-02-09 LAB — OB RESULTS CONSOLE RPR: RPR: NONREACTIVE

## 2014-02-09 LAB — OB RESULTS CONSOLE ANTIBODY SCREEN: Antibody Screen: NEGATIVE

## 2014-02-09 LAB — OB RESULTS CONSOLE HIV ANTIBODY (ROUTINE TESTING): HIV: NONREACTIVE

## 2014-02-10 ENCOUNTER — Ambulatory Visit (HOSPITAL_COMMUNITY)
Admission: RE | Admit: 2014-02-10 | Discharge: 2014-02-10 | Disposition: A | Discharge status: Home / Self Care | Payer: Medicaid Other | Source: Ambulatory Visit | Attending: Nurse Practitioner | Admitting: Nurse Practitioner

## 2014-02-10 DIAGNOSIS — Z3689 Encounter for other specified antenatal screening: Secondary | ICD-10-CM | POA: Diagnosis not present

## 2014-02-10 DIAGNOSIS — Z1389 Encounter for screening for other disorder: Secondary | ICD-10-CM

## 2014-02-12 ENCOUNTER — Encounter: Payer: Self-pay | Admitting: Cardiovascular Disease

## 2014-02-12 ENCOUNTER — Ambulatory Visit (INDEPENDENT_AMBULATORY_CARE_PROVIDER_SITE_OTHER): Payer: Medicaid Other | Admitting: Cardiovascular Disease

## 2014-02-12 VITALS — BP 110/74 | HR 95 | Ht 62.0 in | Wt 165.1 lb

## 2014-02-12 DIAGNOSIS — I4949 Other premature depolarization: Secondary | ICD-10-CM

## 2014-02-12 DIAGNOSIS — I493 Ventricular premature depolarization: Secondary | ICD-10-CM | POA: Insufficient documentation

## 2014-02-12 LAB — BASIC METABOLIC PANEL
BUN: 8 mg/dL (ref 6–23)
CALCIUM: 9.9 mg/dL (ref 8.4–10.5)
CHLORIDE: 103 meq/L (ref 96–112)
CO2: 26 meq/L (ref 19–32)
CREATININE: 0.6 mg/dL (ref 0.4–1.2)
GFR: 145.48 mL/min (ref >60.00)
Glucose, Bld: 97 mg/dL (ref 70–99)
Potassium: 3.3 mEq/L — ABNORMAL LOW (ref 3.5–5.1)
SODIUM: 136 meq/L (ref 135–145)

## 2014-02-12 LAB — TSH: TSH: 0.46 u[IU]/mL (ref 0.35–4.50)

## 2014-02-12 NOTE — Patient Instructions (Signed)
Your physician recommends that you schedule a follow-up appointment  As needed with Dr. Angelena Form  Your physician has requested that you have an echocardiogram. Echocardiography is a painless test that uses sound waves to create images of your heart. It provides your doctor with information about the size and shape of your heart and how well your heart's chambers and valves are working. This procedure takes approximately one hour. There are no restrictions for this procedure.

## 2014-02-12 NOTE — Progress Notes (Signed)
     History of Present Illness: 28 yo female with no significant past medical history referred today for evaluation of palpitations. She is now 6 months pregnant. She has two children. She has uterine fibroids. She has had two C-sections. She has been feeling "hard heartbeats". She does not notice her heart racing. No palpitations. Overall feeling well. No chest pain or SOB.   Primary Care Physician: [facility]  Past Medical History  Diagnosis Date  . Preterm labor     contractions, no dilation  . Fibroid     Past Surgical History  Procedure Laterality Date  . Cesarean section      Current Outpatient Prescriptions  Medication Sig Dispense Refill  . Prenatal Vit-Fe Fumarate-FA (MULTIVITAMIN-PRENATAL) 27-0.8 MG TABS tablet Take 1 tablet by mouth daily at 12 noon.       No current facility-administered medications for this visit.    Allergies  Allergen Reactions  . Percocet [Oxycodone-Acetaminophen] Other (See Comments)    Blisters on face    History   Social History  . Marital Status: Single    Spouse Name: N/A    Number of Children: 2  . Years of Education: N/A   Occupational History  . CNA    Social History Main Topics  . Smoking status: Never Smoker   . Smokeless tobacco: Never Used  . Alcohol Use: No     Comment: Occasional   . Drug Use: No     Comment: stopped with +preg  . Sexual Activity: Yes   Other Topics Concern  . Not on file   Social History Narrative  . No narrative on file    Family History  Problem Relation Age of Onset  . Hypertension Mother   . Diabetes Maternal Grandmother   . Hypertension Maternal Grandmother   . Heart disease Maternal Grandmother     3 heart attacks    Review of Systems:  As stated in the HPI and otherwise negative.   BP 110/74  Pulse 95  Ht 5\' 2"  (1.575 m)  Wt 165 lb 1.9 oz (74.898 kg)  BMI 30.19 kg/m2  LMP 08/27/2013  Physical Examination: General: Well developed, well  nourished, NAD HEENT: OP clear, mucus membranes moist SKIN: warm, dry. No rashes. Neuro: No focal deficits Musculoskeletal: Muscle strength 5/5 all ext Psychiatric: Mood and affect normal Neck: No JVD, no carotid bruits, no thyromegaly, no lymphadenopathy. Lungs:Clear bilaterally, no wheezes, rhonci, crackles Cardiovascular: Regular rate and rhythm. No murmurs, gallops or rubs. Abdomen:Soft. Bowel sounds present. Non-tender.  Extremities: No lower extremity edema. Pulses are 2 + in the bilateral DP/PT.  EKG: Sinus with PVCs. Rate 95 bpm.   Assessment and Plan:   1. PVCs: Demonstrated on EKG today and likely the cause of her symptoms. I suspect these will resolve following her pregnancy. Will get echo to assess LV function and exclude structural heart disease. Will check TSH and BMET today. She is asked to avoid stimulants such as caffeine. I have reassured her that these are likely benign. Will call with test results and see as needed.

## 2014-02-13 ENCOUNTER — Other Ambulatory Visit: Payer: Self-pay | Admitting: *Deleted

## 2014-02-13 DIAGNOSIS — I4949 Other premature depolarization: Secondary | ICD-10-CM

## 2014-02-13 MED ORDER — POTASSIUM CHLORIDE CRYS ER 20 MEQ PO TBCR: 40.0000 meq | EXTENDED_RELEASE_TABLET | Freq: Every day | ORAL | Status: DC

## 2014-02-16 ENCOUNTER — Ambulatory Visit (HOSPITAL_COMMUNITY): Payer: Medicaid Other | Attending: Cardiovascular Disease | Admitting: Cardiology

## 2014-02-16 ENCOUNTER — Telehealth: Payer: Self-pay | Admitting: Cardiovascular Disease

## 2014-02-16 DIAGNOSIS — I493 Ventricular premature depolarization: Secondary | ICD-10-CM

## 2014-02-16 DIAGNOSIS — I4949 Other premature depolarization: Secondary | ICD-10-CM | POA: Diagnosis not present

## 2014-02-16 NOTE — Progress Notes (Signed)
Echo performed. 

## 2014-02-20 ENCOUNTER — Other Ambulatory Visit (INDEPENDENT_AMBULATORY_CARE_PROVIDER_SITE_OTHER): Payer: Medicaid Other

## 2014-02-20 DIAGNOSIS — I4949 Other premature depolarization: Secondary | ICD-10-CM

## 2014-02-20 LAB — BASIC METABOLIC PANEL
BUN: 10 mg/dL (ref 6–23)
CALCIUM: 9.3 mg/dL (ref 8.4–10.5)
CO2: 25 meq/L (ref 19–32)
Chloride: 102 mEq/L (ref 96–112)
Creatinine, Ser: 0.6 mg/dL (ref 0.4–1.2)
GFR: 154.03 mL/min (ref >60.00)
Glucose, Bld: 86 mg/dL (ref 70–99)
POTASSIUM: 3.7 meq/L (ref 3.5–5.1)
SODIUM: 135 meq/L (ref 135–145)

## 2014-03-18 ENCOUNTER — Ambulatory Visit: Payer: Self-pay | Admitting: Cardiology

## 2014-04-27 ENCOUNTER — Encounter: Payer: Self-pay | Admitting: Cardiovascular Disease

## 2014-05-04 ENCOUNTER — Telehealth: Payer: Self-pay | Admitting: Cardiovascular Disease

## 2014-05-04 NOTE — Telephone Encounter (Signed)
Spoke with pt. She reports "hard heartbeats" that occur after every meal. Occasionally will happen at other times also.  Also feels very light headed with these heartbeats.  Has to sit down. Usually will improve within 15-20 minutes but sometimes lasts an hour. Is avoiding caffeine. Symptoms improved after potassium started earlier this year but returned about a month ago.  States she saw OB doctor and blood pressure and glucose are OK.  C section is scheduled for May 27, 2014.

## 2014-05-04 NOTE — Telephone Encounter (Signed)
New message     Pt is [redacted]wk pregnant.  After she eats, she feels fainty and she is feeling her heart beat "hard" in her chest.  Her ob told her to call us

## 2014-05-04 NOTE — Telephone Encounter (Signed)
Left message to call back  

## 2014-05-04 NOTE — Telephone Encounter (Signed)
I would not make any changes at this time. No further cardiac workup. cdm

## 2014-05-05 NOTE — Telephone Encounter (Signed)
Returning Pat's phone call.  Advised that Dr. Angelena Form would not make any changes at this time and that did not need any further testing from cardiac standpoint.  She verbalizes understanding.

## 2014-05-05 NOTE — Telephone Encounter (Signed)
Follow up  ° ° ° °Returning call back to nurse  °

## 2014-05-25 ENCOUNTER — Encounter (HOSPITAL_COMMUNITY): Payer: Self-pay

## 2014-05-25 ENCOUNTER — Encounter (HOSPITAL_COMMUNITY): Payer: Self-pay | Admitting: Obstetrics & Gynecology

## 2014-05-26 ENCOUNTER — Encounter (HOSPITAL_COMMUNITY)
Admission: RE | Admit: 2014-05-26 | Discharge: 2014-05-26 | Disposition: A | Discharge status: Home / Self Care | Payer: Medicaid Other | Source: Ambulatory Visit | Attending: Obstetrics & Gynecology | Admitting: Obstetrics & Gynecology

## 2014-05-26 ENCOUNTER — Encounter (HOSPITAL_COMMUNITY): Payer: Self-pay

## 2014-05-26 HISTORY — DX: Anemia, unspecified: D64.9

## 2014-05-26 HISTORY — DX: Headache, unspecified: R51.9

## 2014-05-26 HISTORY — DX: Cardiac arrhythmia, unspecified: I49.9

## 2014-05-26 HISTORY — DX: Heartburn: R12

## 2014-05-26 HISTORY — DX: Other specified pregnancy related conditions, unspecified trimester: O26.899

## 2014-05-26 HISTORY — DX: Headache: R51

## 2014-05-26 LAB — CBC
HCT: 33.1 % — ABNORMAL LOW (ref 36.0–46.0)
Hemoglobin: 11.7 g/dL — ABNORMAL LOW (ref 12.0–15.0)
MCH: 33.2 pg (ref 26.0–34.0)
MCHC: 35.3 g/dL (ref 30.0–36.0)
MCV: 94 fL (ref 78.0–100.0)
PLATELETS: 145 10*3/uL — AB (ref 150–400)
RBC: 3.52 MIL/uL — AB (ref 3.87–5.11)
RDW: 13.6 % (ref 11.5–15.5)
WBC: 8.5 10*3/uL (ref 4.0–10.5)

## 2014-05-26 LAB — ABO/RH: ABO/RH(D): AB POS

## 2014-05-26 LAB — RPR

## 2014-05-26 LAB — PREPARE RBC (CROSSMATCH)

## 2014-05-26 NOTE — Patient Instructions (Addendum)
Your procedure is scheduled on: 05/27/14  Enter through the Main Entrance at :7:30 am Pick up desk phone and dial [phone] and inform us of your arrival.  Please call [phone] if you have any problems the morning of surgery.  Remember: Do not eat food or drink liquids, including water, after midnight:tonight   You may brush your teeth the morning of surgery.   DO NOT wear jewelry, eye make-up, lipstick,body lotion, or dark fingernail polish.  (Polished toes are ok) You may wear deodorant.  If you are to be admitted after surgery, leave suitcase in car until your room has been assigned. Patients discharged on the day of surgery will not be allowed to drive home. Wear loose fitting, comfortable clothes for your ride home.

## 2014-05-27 ENCOUNTER — Inpatient Hospital Stay (HOSPITAL_COMMUNITY)
Admission: RE | Admit: 2014-05-27 | Discharge: 2014-05-30 | DRG: 766 | Disposition: A | Discharge status: Home / Self Care | Payer: Medicaid Other | Source: Ambulatory Visit | Attending: Obstetrics & Gynecology | Admitting: Obstetrics & Gynecology

## 2014-05-27 ENCOUNTER — Encounter (HOSPITAL_COMMUNITY)
Admission: RE | Disposition: A | Discharge status: Home / Self Care | Payer: Self-pay | Source: Ambulatory Visit | Attending: Obstetrics & Gynecology

## 2014-05-27 ENCOUNTER — Encounter (HOSPITAL_COMMUNITY): Payer: Self-pay | Admitting: Anesthesiology

## 2014-05-27 ENCOUNTER — Inpatient Hospital Stay (HOSPITAL_COMMUNITY): Payer: Medicaid Other | Admitting: Anesthesiology

## 2014-05-27 DIAGNOSIS — I493 Ventricular premature depolarization: Secondary | ICD-10-CM | POA: Diagnosis present

## 2014-05-27 DIAGNOSIS — K219 Gastro-esophageal reflux disease without esophagitis: Secondary | ICD-10-CM | POA: Diagnosis present

## 2014-05-27 DIAGNOSIS — D259 Leiomyoma of uterus, unspecified: Secondary | ICD-10-CM | POA: Diagnosis present

## 2014-05-27 DIAGNOSIS — O99613 Diseases of the digestive system complicating pregnancy, third trimester: Secondary | ICD-10-CM | POA: Diagnosis present

## 2014-05-27 DIAGNOSIS — O99824 Streptococcus B carrier state complicating childbirth: Secondary | ICD-10-CM | POA: Diagnosis present

## 2014-05-27 DIAGNOSIS — Z8249 Family history of ischemic heart disease and other diseases of the circulatory system: Secondary | ICD-10-CM | POA: Diagnosis not present

## 2014-05-27 DIAGNOSIS — O3421 Maternal care for scar from previous cesarean delivery: Principal | ICD-10-CM | POA: Diagnosis present

## 2014-05-27 DIAGNOSIS — O3413 Maternal care for benign tumor of corpus uteri, third trimester: Secondary | ICD-10-CM | POA: Diagnosis present

## 2014-05-27 DIAGNOSIS — Z833 Family history of diabetes mellitus: Secondary | ICD-10-CM | POA: Diagnosis not present

## 2014-05-27 DIAGNOSIS — Z3A39 39 weeks gestation of pregnancy: Secondary | ICD-10-CM | POA: Diagnosis present

## 2014-05-27 LAB — POTASSIUM: Potassium: 3.9 mEq/L (ref 3.7–5.3)

## 2014-05-27 SURGERY — Surgical Case
Anesthesia: Spinal | Site: Abdomen

## 2014-05-27 MED ORDER — METOCLOPRAMIDE HCL 5 MG/ML IJ SOLN
INTRAMUSCULAR | Status: DC | PRN
  Administered 2014-05-27: 10 mg via INTRAVENOUS

## 2014-05-27 MED ORDER — ONDANSETRON HCL 4 MG/2ML IJ SOLN
4.0000 mg | Freq: Three times a day (TID) | INTRAMUSCULAR | Status: DC | PRN
  Administered 2014-05-27: 4 mg via INTRAVENOUS

## 2014-05-27 MED ORDER — MORPHINE SULFATE (PF) 0.5 MG/ML IJ SOLN
INTRAMUSCULAR | Status: DC | PRN
  Administered 2014-05-27: .15 mg via INTRATHECAL

## 2014-05-27 MED ORDER — SIMETHICONE 80 MG PO CHEW
80.0000 mg | CHEWABLE_TABLET | Freq: Three times a day (TID) | ORAL | Status: DC
  Administered 2014-05-28 – 2014-05-30 (×6): 80 mg via ORAL
  Filled 2014-05-27 (×6): qty 1

## 2014-05-27 MED ORDER — PRENATAL MULTIVITAMIN CH
1.0000 | ORAL_TABLET | Freq: Every day | ORAL | Status: DC
  Administered 2014-05-28 – 2014-05-30 (×3): 1 via ORAL
  Filled 2014-05-27 (×3): qty 1

## 2014-05-27 MED ORDER — BUPIVACAINE HCL (PF) 0.5 % IJ SOLN
INTRAMUSCULAR | Status: AC
  Filled 2014-05-27: qty 30

## 2014-05-27 MED ORDER — CEFAZOLIN SODIUM-DEXTROSE 2-3 GM-% IV SOLR
INTRAVENOUS | Status: AC
Start: 2014-05-27 — End: 2014-05-27
  Filled 2014-05-27: qty 50

## 2014-05-27 MED ORDER — OXYTOCIN 40 UNITS IN LACTATED RINGERS INFUSION - SIMPLE MED
INTRAVENOUS | Status: DC | PRN
  Administered 2014-05-27: 40 [IU] via INTRAVENOUS

## 2014-05-27 MED ORDER — NALBUPHINE HCL 10 MG/ML IJ SOLN: 5.0000 mg | Freq: Once | INTRAMUSCULAR | Status: AC | PRN

## 2014-05-27 MED ORDER — ONDANSETRON HCL 4 MG/2ML IJ SOLN
INTRAMUSCULAR | Status: AC
  Filled 2014-05-27: qty 2

## 2014-05-27 MED ORDER — FENTANYL CITRATE 0.05 MG/ML IJ SOLN
INTRAMUSCULAR | Status: AC
  Filled 2014-05-27: qty 2

## 2014-05-27 MED ORDER — HYDROMORPHONE HCL 2 MG PO TABS
1.0000 mg | ORAL_TABLET | ORAL | Status: DC | PRN
  Administered 2014-05-29 – 2014-05-30 (×2): 1 mg via ORAL
  Filled 2014-05-27 (×2): qty 1

## 2014-05-27 MED ORDER — BUPIVACAINE HCL (PF) 0.5 % IJ SOLN
INTRAMUSCULAR | Status: DC | PRN
  Administered 2014-05-27: 50 mL

## 2014-05-27 MED ORDER — OXYTOCIN 10 UNIT/ML IJ SOLN
INTRAMUSCULAR | Status: AC
  Filled 2014-05-27: qty 4

## 2014-05-27 MED ORDER — NALBUPHINE HCL 10 MG/ML IJ SOLN: 5.0000 mg | INTRAMUSCULAR | Status: DC | PRN

## 2014-05-27 MED ORDER — SODIUM CHLORIDE 0.9 % IV BOLUS (SEPSIS)
1000.0000 mL | Freq: Once | INTRAVENOUS | Status: AC
  Administered 2014-05-27: 1000 mL via INTRAVENOUS

## 2014-05-27 MED ORDER — ONDANSETRON HCL 4 MG PO TABS: 4.0000 mg | ORAL_TABLET | ORAL | Status: DC | PRN

## 2014-05-27 MED ORDER — SIMETHICONE 80 MG PO CHEW: 80.0000 mg | CHEWABLE_TABLET | ORAL | Status: DC | PRN

## 2014-05-27 MED ORDER — PHENYLEPHRINE 8 MG IN D5W 100 ML (0.08MG/ML) PREMIX OPTIME
INJECTION | INTRAVENOUS | Status: AC
  Filled 2014-05-27: qty 100

## 2014-05-27 MED ORDER — PHENYLEPHRINE HCL 10 MG/ML IJ SOLN
INTRAMUSCULAR | Status: DC | PRN
  Administered 2014-05-27 (×2): 80 ug via INTRAVENOUS
  Administered 2014-05-27: 40 ug via INTRAVENOUS

## 2014-05-27 MED ORDER — NALOXONE HCL 1 MG/ML IJ SOLN
1.0000 ug/kg/h | INTRAMUSCULAR | Status: DC | PRN
  Filled 2014-05-27: qty 2

## 2014-05-27 MED ORDER — LACTATED RINGERS IV SOLN
INTRAVENOUS | Status: DC
  Administered 2014-05-27: 10:00:00 via INTRAVENOUS

## 2014-05-27 MED ORDER — FENTANYL CITRATE 0.05 MG/ML IJ SOLN
INTRAMUSCULAR | Status: AC
  Administered 2014-05-27: 50 ug via INTRAVENOUS
  Filled 2014-05-27: qty 2

## 2014-05-27 MED ORDER — MENTHOL 3 MG MT LOZG: 1.0000 | LOZENGE | OROMUCOSAL | Status: DC | PRN

## 2014-05-27 MED ORDER — SCOPOLAMINE 1 MG/3DAYS TD PT72
MEDICATED_PATCH | TRANSDERMAL | Status: AC
  Administered 2014-05-27: 1.5 mg via TRANSDERMAL
  Filled 2014-05-27: qty 1

## 2014-05-27 MED ORDER — PHENYLEPHRINE 8 MG IN D5W 100 ML (0.08MG/ML) PREMIX OPTIME
INJECTION | INTRAVENOUS | Status: DC | PRN
  Administered 2014-05-27: 60 ug/min via INTRAVENOUS

## 2014-05-27 MED ORDER — METOCLOPRAMIDE HCL 5 MG/ML IJ SOLN
INTRAMUSCULAR | Status: AC
  Filled 2014-05-27: qty 2

## 2014-05-27 MED ORDER — FENTANYL CITRATE 0.05 MG/ML IJ SOLN
INTRAMUSCULAR | Status: DC | PRN
  Administered 2014-05-27: 25 ug via INTRATHECAL

## 2014-05-27 MED ORDER — PHENYLEPHRINE 40 MCG/ML (10ML) SYRINGE FOR IV PUSH (FOR BLOOD PRESSURE SUPPORT)
PREFILLED_SYRINGE | INTRAVENOUS | Status: AC
  Filled 2014-05-27: qty 5

## 2014-05-27 MED ORDER — ZOLPIDEM TARTRATE 5 MG PO TABS: 5.0000 mg | ORAL_TABLET | Freq: Every evening | ORAL | Status: DC | PRN

## 2014-05-27 MED ORDER — MORPHINE SULFATE 0.5 MG/ML IJ SOLN
INTRAMUSCULAR | Status: AC
  Filled 2014-05-27: qty 10

## 2014-05-27 MED ORDER — LACTATED RINGERS IV BOLUS (SEPSIS)
1000.0000 mL | Freq: Once | INTRAVENOUS | Status: AC
  Administered 2014-05-27: 1000 mL via INTRAVENOUS

## 2014-05-27 MED ORDER — ONDANSETRON HCL 4 MG/2ML IJ SOLN
4.0000 mg | INTRAMUSCULAR | Status: DC | PRN
  Administered 2014-05-27: 4 mg via INTRAVENOUS
  Filled 2014-05-27: qty 2

## 2014-05-27 MED ORDER — IBUPROFEN 600 MG PO TABS
600.0000 mg | ORAL_TABLET | Freq: Four times a day (QID) | ORAL | Status: DC
  Administered 2014-05-28 – 2014-05-30 (×11): 600 mg via ORAL
  Filled 2014-05-27 (×11): qty 1

## 2014-05-27 MED ORDER — DIPHENHYDRAMINE HCL 25 MG PO CAPS
25.0000 mg | ORAL_CAPSULE | Freq: Four times a day (QID) | ORAL | Status: DC | PRN
  Administered 2014-05-30: 25 mg via ORAL
  Filled 2014-05-27: qty 1

## 2014-05-27 MED ORDER — LACTATED RINGERS IV SOLN
INTRAVENOUS | Status: DC
  Administered 2014-05-27 (×4): via INTRAVENOUS

## 2014-05-27 MED ORDER — ONDANSETRON HCL 4 MG/2ML IJ SOLN
INTRAMUSCULAR | Status: DC | PRN
  Administered 2014-05-27: 4 mg via INTRAVENOUS

## 2014-05-27 MED ORDER — SENNOSIDES-DOCUSATE SODIUM 8.6-50 MG PO TABS
2.0000 | ORAL_TABLET | ORAL | Status: DC
  Administered 2014-05-28 (×2): 2 via ORAL
  Filled 2014-05-27 (×3): qty 2

## 2014-05-27 MED ORDER — SODIUM CHLORIDE 0.9 % IJ SOLN: 3.0000 mL | INTRAMUSCULAR | Status: DC | PRN

## 2014-05-27 MED ORDER — OXYTOCIN 40 UNITS IN LACTATED RINGERS INFUSION - SIMPLE MED: 62.5000 mL/h | INTRAVENOUS | Status: AC

## 2014-05-27 MED ORDER — TETANUS-DIPHTH-ACELL PERTUSSIS 5-2.5-18.5 LF-MCG/0.5 IM SUSP: 0.5000 mL | Freq: Once | INTRAMUSCULAR | Status: DC

## 2014-05-27 MED ORDER — SCOPOLAMINE 1 MG/3DAYS TD PT72
1.0000 | MEDICATED_PATCH | Freq: Once | TRANSDERMAL | Status: DC
  Administered 2014-05-27: 1.5 mg via TRANSDERMAL

## 2014-05-27 MED ORDER — WITCH HAZEL-GLYCERIN EX PADS: 1.0000 "application " | MEDICATED_PAD | CUTANEOUS | Status: DC | PRN

## 2014-05-27 MED ORDER — NALOXONE HCL 0.4 MG/ML IJ SOLN: 0.4000 mg | INTRAMUSCULAR | Status: DC | PRN

## 2014-05-27 MED ORDER — SIMETHICONE 80 MG PO CHEW
80.0000 mg | CHEWABLE_TABLET | ORAL | Status: DC
  Administered 2014-05-28 – 2014-05-29 (×3): 80 mg via ORAL
  Filled 2014-05-27 (×3): qty 1

## 2014-05-27 MED ORDER — DIPHENHYDRAMINE HCL 25 MG PO CAPS
25.0000 mg | ORAL_CAPSULE | ORAL | Status: DC | PRN
  Filled 2014-05-27: qty 1

## 2014-05-27 MED ORDER — DIBUCAINE 1 % RE OINT: 1.0000 "application " | TOPICAL_OINTMENT | RECTAL | Status: DC | PRN

## 2014-05-27 MED ORDER — CEFAZOLIN SODIUM-DEXTROSE 2-3 GM-% IV SOLR
2.0000 g | INTRAVENOUS | Status: AC
  Administered 2014-05-27: 2 g via INTRAVENOUS

## 2014-05-27 MED ORDER — LANOLIN HYDROUS EX OINT: 1.0000 "application " | TOPICAL_OINTMENT | CUTANEOUS | Status: DC | PRN

## 2014-05-27 MED ORDER — PRENATAL MULTIVITAMIN CH: 1.0000 | ORAL_TABLET | Freq: Every day | ORAL | Status: DC

## 2014-05-27 MED ORDER — MEPERIDINE HCL 25 MG/ML IJ SOLN: 6.2500 mg | INTRAMUSCULAR | Status: DC | PRN

## 2014-05-27 MED ORDER — FENTANYL CITRATE 0.05 MG/ML IJ SOLN
25.0000 ug | INTRAMUSCULAR | Status: DC | PRN
  Administered 2014-05-27 (×2): 50 ug via INTRAVENOUS

## 2014-05-27 MED ORDER — DIPHENHYDRAMINE HCL 50 MG/ML IJ SOLN: 12.5000 mg | INTRAMUSCULAR | Status: DC | PRN

## 2014-05-27 MED ORDER — BUPIVACAINE IN DEXTROSE 0.75-8.25 % IT SOLN
INTRATHECAL | Status: DC | PRN
  Administered 2014-05-27: 1.3 mL via INTRATHECAL

## 2014-05-27 MED ORDER — SCOPOLAMINE 1 MG/3DAYS TD PT72: 1.0000 | MEDICATED_PATCH | Freq: Once | TRANSDERMAL | Status: DC

## 2014-05-27 MED ORDER — LACTATED RINGERS IV SOLN
INTRAVENOUS | Status: DC
  Administered 2014-05-27: via INTRAVENOUS

## 2014-05-27 SURGICAL SUPPLY — 41 items
APL SKNCLS STERI-STRIP NONHPOA (GAUZE/BANDAGES/DRESSINGS) ×1
BENZOIN TINCTURE PRP APPL 2/3 (GAUZE/BANDAGES/DRESSINGS) ×3 IMPLANT
BINDER ABD UNIV 10 28-50 (GAUZE/BANDAGES/DRESSINGS) ×1 IMPLANT
BINDER ABD UNIV 12 45-62 (WOUND CARE) IMPLANT
BINDER ABDOM UNIV 10 (GAUZE/BANDAGES/DRESSINGS) ×3
BINDER ABDOMINAL 46IN 62IN (WOUND CARE)
CLAMP CORD UMBIL (MISCELLANEOUS) ×2 IMPLANT
CLOSURE WOUND 1/2 X4 (GAUZE/BANDAGES/DRESSINGS) ×1
CLOTH BEACON ORANGE TIMEOUT ST (SAFETY) ×3 IMPLANT
DRAPE SHEET LG 3/4 BI-LAMINATE (DRAPES) ×2 IMPLANT
DRSG OPSITE POSTOP 4X10 (GAUZE/BANDAGES/DRESSINGS) ×3 IMPLANT
DURAPREP 26ML APPLICATOR (WOUND CARE) ×3 IMPLANT
ELECT REM PT RETURN 9FT ADLT (ELECTROSURGICAL) ×3
ELECTRODE REM PT RTRN 9FT ADLT (ELECTROSURGICAL) ×1 IMPLANT
EXTRACTOR VACUUM KIWI (MISCELLANEOUS) IMPLANT
GLOVE BIO SURGEON STRL SZ7 (GLOVE) ×3 IMPLANT
GLOVE BIOGEL PI IND STRL 7.0 (GLOVE) ×1 IMPLANT
GLOVE BIOGEL PI INDICATOR 7.0 (GLOVE) ×6
GOWN STRL REUS W/TWL LRG LVL3 (GOWN DISPOSABLE) ×6 IMPLANT
KIT ABG SYR 3ML LUER SLIP (SYRINGE) IMPLANT
NDL HYPO 25X5/8 SAFETYGLIDE (NEEDLE) IMPLANT
NEEDLE HYPO 22GX1.5 SAFETY (NEEDLE) ×3 IMPLANT
NEEDLE HYPO 25X5/8 SAFETYGLIDE (NEEDLE) IMPLANT
NS IRRIG 1000ML POUR BTL (IV SOLUTION) ×3 IMPLANT
PACK C SECTION WH (CUSTOM PROCEDURE TRAY) ×3 IMPLANT
PAD OB MATERNITY 4.3X12.25 (PERSONAL CARE ITEMS) ×3 IMPLANT
RTRCTR C-SECT PINK 25CM LRG (MISCELLANEOUS) IMPLANT
SPONGE SURGIFOAM ABS GEL 12-7 (HEMOSTASIS) IMPLANT
STAPLER VISISTAT 35W (STAPLE) IMPLANT
STRIP CLOSURE SKIN 1/2X4 (GAUZE/BANDAGES/DRESSINGS) ×2 IMPLANT
SUT PDS AB 0 CTX 60 (SUTURE) IMPLANT
SUT PLAIN 0 NONE (SUTURE) IMPLANT
SUT SILK 0 TIES 10X30 (SUTURE) IMPLANT
SUT VIC AB 0 CT1 36 (SUTURE) ×9 IMPLANT
SUT VIC AB 3-0 CT1 27 (SUTURE) ×3
SUT VIC AB 3-0 CT1 TAPERPNT 27 (SUTURE) ×1 IMPLANT
SUT VIC AB 4-0 KS 27 (SUTURE) ×2 IMPLANT
SYR CONTROL 10ML LL (SYRINGE) ×3 IMPLANT
TOWEL OR 17X24 6PK STRL BLUE (TOWEL DISPOSABLE) ×3 IMPLANT
TRAY FOLEY CATH 14FR (SET/KITS/TRAYS/PACK) ×3 IMPLANT
WATER STERILE IRR 1000ML POUR (IV SOLUTION) ×3 IMPLANT

## 2014-05-27 NOTE — Anesthesia Postprocedure Evaluation (Signed)
  Anesthesia Post-op Note  Patient: Cynthia Tran  Procedure(s) Performed: Procedure(s): REPEAT CESAREAN SECTION (N/A)  Patient Location: Mother/Baby  Anesthesia Type:Spinal  Level of Consciousness: awake, alert  and oriented  Airway and Oxygen Therapy: Patient Spontanous Breathing  Post-op Pain: none  Post-op Assessment: Post-op Vital signs reviewed and Patient's Cardiovascular Status Stable  Post-op Vital Signs: Reviewed and stable  Last Vitals:  Filed Vitals:   05/27/14 1530  BP: 106/46  Pulse: 101  Temp: 36.7 C  Resp: 22    Complications: No apparent anesthesia complications

## 2014-05-27 NOTE — Transfer of Care (Signed)
Immediate Anesthesia Transfer of Care Note  Patient: Cynthia Tran  Procedure(s) Performed: Procedure(s): REPEAT CESAREAN SECTION (N/A)  Patient Location: PACU  Anesthesia Type:Spinal  Level of Consciousness: awake, alert  and oriented  Airway & Oxygen Therapy: Patient Spontanous Breathing  Post-op Assessment: Report given to PACU RN and Post -op Vital signs reviewed and stable  Post vital signs: Reviewed and stable  Complications: No apparent anesthesia complications

## 2014-05-27 NOTE — H&P (Signed)
LABOR ADMISSION HISTORY AND PHYSICAL  Haidyn Kilburg Ensey is a 28 y.o. female [phone] with IUP at [redacted]w[redacted]d by LMP presenting for rLTCS, hx of 2 previous c/s  Dating: By LMP c/w [redacted]w[redacted]d anatomy scan --->  Estimated Date of Delivery: 06/03/14 - 3 fibroids noted, largest 4x4x4cm right lateral   Prenatal History/Complications:  Past Medical History: Past Medical History  Diagnosis Date  . Preterm labor     contractions, no dilation  . Fibroid   . Dysrhythmia 2015  . Heartburn during pregnancy   . Headache     history of migraines  . Anemia     Past Surgical History: Past Surgical History  Procedure Laterality Date  . Cesarean section      Obstetrical History: OB History    Gravida Para Term Preterm AB TAB SAB Ectopic Multiple Living   3 2 2       4      Social History: History   Social History  . Marital Status: Single    Spouse Name: N/A    Number of Children: 2  . Years of Education: N/A   Occupational History  . CNA    Social History Main Topics  . Smoking status: Never Smoker   . Smokeless tobacco: Never Used  . Alcohol Use: No     Comment: Occasional   . Drug Use: No     Comment: stopped with +preg  . Sexual Activity: Yes   Other Topics Concern  . None   Social History Narrative    Family History: Family History  Problem Relation Age of Onset  . Hypertension Mother   . Diabetes Maternal Grandmother   . Hypertension Maternal Grandmother   . Heart disease Maternal Grandmother     3 heart attacks    Allergies: Allergies  Allergen Reactions  . Percocet [Oxycodone-Acetaminophen] Other (See Comments)    Blisters on face    Prescriptions prior to admission  Medication Sig Dispense Refill Last Dose  . potassium chloride SA (K-DUR,KLOR-CON) 20 MEQ tablet Take 2 tablets (40 mEq total) by mouth daily. 60 tablet 3 05/26/2014 at Unknown time  . Prenatal Vit-Fe Fumarate-FA (MULTIVITAMIN-PRENATAL) 27-0.8 MG TABS tablet Take 1 tablet by mouth daily at 12 noon.    05/26/2014 at Unknown time     Review of Systems   All systems reviewed and negative except as stated in HPI  Blood pressure 120/71, pulse 75, temperature 98.1 F (36.7 C), temperature source Oral, resp. rate 18, last menstrual period 08/27/2013, SpO2 100 %. General appearance: alert and cooperative Lungs: clear to auscultation bilaterally Heart: regular rate and rhythm Abdomen: soft, non-tender; bowel sounds normal Extremities: Homans sign is negative, no sign of DVT     Prenatal labs: ABO, Rh: --/--/AB POS, AB POS (12/01 0935) Antibody: NEG (12/01 0935) Rubella:   RPR: NON REAC (12/01 0931)  HBsAg: Negative (08/17 0000)  HIV: Non-reactive (08/17 0000)  GBS:   positive 1 hr Glucola 143 => 84/102/98/103; repeat 3h gtt 76/131/103/90 Genetic screening  Too late Anatomy US normal     Results for orders placed or performed during the hospital encounter of 05/26/14 (from the past 24 hour(s))  Prepare RBC (crossmatch)   Collection Time: 05/26/14  9:30 AM  Result Value Ref Range   Order Confirmation ORDER PROCESSED BY BLOOD BANK   CBC   Collection Time: 05/26/14  9:31 AM  Result Value Ref Range   WBC 8.5 4.0 - 10.5 K/uL   RBC 3.52 (L) 3.87 -  5.11 MIL/uL   Hemoglobin 11.7 (L) 12.0 - 15.0 g/dL   HCT 33.1 (L) 36.0 - 46.0 %   MCV 94.0 78.0 - 100.0 fL   MCH 33.2 26.0 - 34.0 pg   MCHC 35.3 30.0 - 36.0 g/dL   RDW 13.6 11.5 - 15.5 %   Platelets 145 (L) 150 - 400 K/uL  RPR   Collection Time: 05/26/14  9:31 AM  Result Value Ref Range   RPR NON REAC NON REAC  Type and screen   Collection Time: 05/26/14  9:35 AM  Result Value Ref Range   ABO/RH(D) AB POS    Antibody Screen NEG    Sample Expiration 05/29/2014    Unit Number [id number]    Blood Component Type RED CELLS,LR    Unit division 00    Status of Unit ALLOCATED    Transfusion Status OK TO TRANSFUSE    Crossmatch Result Compatible    Unit Number [id number]    Blood Component Type RED CELLS,LR    Unit  division 00    Status of Unit ALLOCATED    Transfusion Status OK TO TRANSFUSE    Crossmatch Result Compatible   ABO/Rh   Collection Time: 05/26/14  9:35 AM  Result Value Ref Range   ABO/RH(D) AB POS     Patient Active Problem List   Diagnosis Date Noted  . PVC (premature ventricular contraction) 02/12/2014    Assessment: KOOPER CHRISWELL is a 28 y.o. G3P2004 at [redacted]w[redacted]d here for repeat LTCS  #MOF: breast #MOC: declines any contraception #Circ:  outpt circ  The risks of cesarean section discussed with the patient included but were not limited to: bleeding which may require transfusion or reoperation; infection which may require antibiotics; injury to bowel, bladder, ureters or other surrounding organs; injury to the fetus; need for additional procedures including hysterectomy in the event of a life-threatening hemorrhage; placental abnormalities wth subsequent pregnancies, incisional problems, thromboembolic phenomenon and other postoperative/anesthesia complications. The patient concurred with the proposed plan, giving informed written consent for the procedure.   Patient has been NPO since 0000 she will remain NPO for procedure. Anesthesia and OR aware. Preoperative prophylactic antibiotics and SCDs ordered on call to the OR.  To OR when ready.   Ragen Laver ROCIO 05/27/2014, 7:59 AM

## 2014-05-27 NOTE — Anesthesia Postprocedure Evaluation (Signed)
  Anesthesia Post-op Note  Patient: Cynthia Tran  Procedure(s) Performed: Procedure(s): REPEAT CESAREAN SECTION (N/A)  Patient Location: PACU  Anesthesia Type:Spinal  Level of Consciousness: awake, alert  and oriented  Airway and Oxygen Therapy: Patient Spontanous Breathing  Post-op Pain: mild  Post-op Assessment: Post-op Vital signs reviewed, Patient's Cardiovascular Status Stable, Respiratory Function Stable, Patent Airway, No signs of Nausea or vomiting, Pain level controlled, No headache, No backache, No residual numbness and No residual motor weakness  Post-op Vital Signs: Reviewed and stable  Last Vitals:  Filed Vitals:   05/27/14 1330  BP: 99/55  Pulse: 98  Temp:   Resp: 38    Complications: No apparent anesthesia complications

## 2014-05-27 NOTE — Plan of Care (Signed)
Problem: Consults Goal: Postpartum Patient Education (See Patient Education module for education specifics.)  Outcome: Progressing  Problem: Phase I Progression Outcomes Goal: Foley catheter patent Outcome: Completed/Met Date Met:  05/27/14 Goal: IS, TCDB as ordered Outcome: Completed/Met Date Met:  05/27/14 Goal: Initial discharge plan identified Outcome: Completed/Met Date Met:  05/27/14

## 2014-05-27 NOTE — Consult Note (Signed)
Neonatology Note:   Attendance at C-section:    I was asked by Dr. Ihor Dow to attend this repeat C/S at term. The mother is a G3P2 AB pos, GBS pos with fibroids and borderline elevated glucose tolerance testing. ROM at delivery, fluid clear. Infant vigorous with good spontaneous cry and tone. Needed only minimal bulb suctioning. Ap 9/9. Lungs clear to ausc in DR. To CN to care of Pediatrician.   Real Cons, MD

## 2014-05-27 NOTE — Op Note (Signed)
Cynthia Tran PROCEDURE DATE: 05/27/2014  PREOPERATIVE DIAGNOSIS: Intrauterine pregnancy at  [redacted]w[redacted]d weeks gestation; repeat cesarean section  POSTOPERATIVE DIAGNOSIS: The same  PROCEDURE: Repeat Low Transverse Cesarean Section  SURGEON:  Dr. Hoyle Sauer L. Harraway-Smith  ASSISTANT:  None  Complications: none immediate   INDICATIONS: Cynthia Tran is a 28 y.o. G3P2004 at [redacted]w[redacted]d here for cesarean section secondary to the indications listed under preoperative diagnosis this will be third cesarean section; please see preoperative note for further details.  The risks of cesarean section were discussed with the patient including but were not limited to: bleeding which may require transfusion or reoperation; infection which may require antibiotics; injury to bowel, bladder, ureters or other surrounding organs; injury to the fetus; need for additional procedures including hysterectomy in the event of a life-threatening hemorrhage; placental abnormalities wth subsequent pregnancies, incisional problems, thromboembolic phenomenon and other postoperative/anesthesia complications.   The patient concurred with the proposed plan, giving informed written consent for the procedure.    FINDINGS:  Viable female infant in cephalic presentation.  Apgars 9 and 9.  Clear amniotic fluid.  Intact placenta, three vessel cord.  Normal uterus, fallopian tubes and ovaries bilaterally.  PROCEDURE IN DETAIL:  The patient preoperatively received intravenous antibiotics and had sequential compression devices applied to her lower extremities.  She was then taken to the operating room where spinal anesthesia was administered and was found to be adequate. She was then placed in a dorsal supine position with a leftward tilt, and prepped and draped in a sterile manner.  A foley catheter was placed into her bladder and attached to constant gravity.  After an adequate timeout was performed, a Pfannenstiel skin incision was made with  scalpel and carried through to the underlying layer of fascia. The fascia was incised in the midline, and this incision was extended bilaterally using the Mayo scissors.  Kocher clamps were applied to the superior aspect of the fascial incision and the underlying rectus muscles were dissected off bluntly. A similar process was carried out on the inferior aspect of the fascial incision. The rectus muscles were separated in the midline bluntly and the peritoneum was entered bluntly. A bladder flap was created with the Metzenbaum scissors and extended laterally with the same. Attention was turned to the lower uterine segment where a low transverse hysterotomy incision was made with a scalpel and extended bilaterally bluntly.  The infant was successfully delivered, the cord was clamped and cut and the infant was handed over to awaiting neonatology team. Uterine massage was then administered, and the placenta delivered intact with a three-vessel cord. The uterus was then cleared of clot and debris.  The hysterotomy was closed with 0 Vicryl in a running locked fashion, and an imbricating layer was also placed with the same suture. The pelvis was cleared of all clot and debris. Hemostasis was confirmed on all surfaces.  The peritoneum and the muscles were reapproximated using 0 Vicryl in 2 layers. The fascia was then closed using 0 Vicryl in a single suture.  The skin was closed with a 4-0 Vicryl subcuticular stitch.  45 cc of 0.5% marcaine was injected into the incision and benzoin and steristrips were applied.  The patient tolerated the procedure well. Sponge, lap, instrument and needle counts were correct x 2.  She was taken to the recovery room in stable condition.   Merla Riches, MD 11:11 AM

## 2014-05-27 NOTE — Anesthesia Procedure Notes (Signed)
Spinal Patient location during procedure: OR Start time: 05/27/2014 9:23 AM Staffing Anesthesiologist: Keats Kingry A. Performed by: anesthesiologist  Preanesthetic Checklist Completed: patient identified, site marked, surgical consent, pre-op evaluation, timeout performed, IV checked, risks and benefits discussed and monitors and equipment checked Spinal Block Patient position: sitting Prep: site prepped and draped and DuraPrep Patient monitoring: heart rate, cardiac monitor, continuous pulse ox and blood pressure Approach: midline Location: L3-4 Injection technique: single-shot Needle Needle type: Sprotte  Needle gauge: 24 G Needle length: 9 cm Needle insertion depth: 4 cm Assessment Sensory level: T4 Additional Notes Patient tolerated procedure well. Adequate sensory level.

## 2014-05-27 NOTE — Plan of Care (Signed)
Problem: Consults Goal: Postpartum Patient Education (See Patient Education module for education specifics.)  Outcome: Progressing Goal: Nutrition Consult-if indicated Outcome: Not Applicable Date Met:  01/04/18

## 2014-05-27 NOTE — Progress Notes (Signed)
Dr. Elonda Husky called as patients B/P continues low she is unable to take anything by mouth but ice chips and when attempts to sit up gets nauseated. She has been unable to breast feed or to be ambulated so far as movement like this makes her dizzy and nauseated. He ordered another 1062ml bolus of LR. Dr. Royce Macadamia from anesthesiology had ordered q1hr V/S until about 12am to continue to monitor pt. This order happened about 1900.

## 2014-05-27 NOTE — Anesthesia Preprocedure Evaluation (Signed)
Anesthesia Evaluation  Patient identified by MRN, date of birth, ID band Patient awake    Reviewed: Allergy & Precautions, H&P , NPO status , Patient's Chart, lab work & pertinent test results  Airway Mallampati: III  TM Distance: >3 FB Neck ROM: Full    Dental no notable dental hx. (+) Teeth Intact   Pulmonary neg pulmonary ROS,  breath sounds clear to auscultation  Pulmonary exam normal       Cardiovascular negative cardio ROS  + dysrhythmias Rhythm:Regular Rate:Normal     Neuro/Psych  Headaches, negative psych ROS   GI/Hepatic Neg liver ROS, GERD-  Medicated and Controlled,  Endo/Other  Obesity  Renal/GU negative Renal ROS     Musculoskeletal negative musculoskeletal ROS (+)   Abdominal (+) + obese,   Peds  Hematology  (+) anemia , Mild thrombocytopenia   Anesthesia Other Findings   Reproductive/Obstetrics (+) Pregnancy Previous C/Section x 2 Hx/o PTL                              Anesthesia Physical Anesthesia Plan  ASA: II  Anesthesia Plan: Spinal   Post-op Pain Management:    Induction:   Airway Management Planned: Natural Airway  Additional Equipment:   Intra-op Plan:   Post-operative Plan:   Informed Consent: I have reviewed the patients History and Physical, chart, labs and discussed the procedure including the risks, benefits and alternatives for the proposed anesthesia with the patient or authorized representative who has indicated his/her understanding and acceptance.     Plan Discussed with: Anesthesiologist, CRNA and Surgeon  Anesthesia Plan Comments:         Anesthesia Quick Evaluation

## 2014-05-27 NOTE — Addendum Note (Signed)
Addendum  created 05/27/14 1645 by Vernice Jefferson, CRNA   Modules edited: Notes Section   Notes Section:  File: [id number]

## 2014-05-27 NOTE — Plan of Care (Signed)
Problem: Phase I Progression Outcomes Goal: Pain controlled with appropriate interventions Outcome: Completed/Met Date Met:  05/27/14     

## 2014-05-28 ENCOUNTER — Encounter (HOSPITAL_COMMUNITY): Payer: Self-pay | Admitting: Obstetrics & Gynecology

## 2014-05-28 LAB — BIRTH TISSUE RECOVERY COLLECTION (PLACENTA DONATION)

## 2014-05-28 LAB — CCBB MATERNAL DONOR DRAW

## 2014-05-28 NOTE — Plan of Care (Signed)
Problem: Phase I Progression Outcomes Goal: VS, stable, temp < 100.4 degrees F Outcome: Completed/Met Date Met:  05/28/14  Problem: Phase II Progression Outcomes Goal: Pain controlled on oral analgesia Outcome: Completed/Met Date Met:  05/28/14 Goal: Tolerating diet Outcome: Completed/Met Date Met:  05/28/14

## 2014-05-28 NOTE — Lactation Note (Signed)
This note was copied from the chart of [place]. Lactation Consultation Note  Initial visit done.  Breastfeeding consultation services and support information given and reviewed. Mom states baby is latching well but she is giving small amount 7-12 mls of formula after feeding because baby still acts hungry.  Mom states she has no milk.  Education given on the presence of colostrum and milk coming to volume in 3-5 days.  Cautioned mom to continue with small amounts of formula only and to discontinue when breasts feel heavy/full.  Mom states she knows how to manually express milk.  Reviewed waking techniques and breast massage.  Encouraged to call with concerns/latch assist.  Patient Name: Cynthia Tran [id number] Date: 05/28/2014 Reason for consult: Initial assessment   Maternal Data Has patient been taught Hand Expression?: Yes Does the patient have breastfeeding experience prior to this delivery?: Yes  Feeding Feeding Type: Formula Nipple Type: Slow - flow Length of feed: 40 min  LATCH Score/Interventions Latch: Grasps breast easily, tongue down, lips flanged, rhythmical sucking.  Audible Swallowing: A few with stimulation Intervention(s): Alternate breast massage  Type of Nipple: Everted at rest and after stimulation  Comfort (Breast/Nipple): Filling, red/small blisters or bruises, mild/mod discomfort  Problem noted: Mild/Moderate discomfort Interventions (Mild/moderate discomfort):  (EBM, options discussed)  Hold (Positioning): Assistance needed to correctly position infant at breast and maintain latch. Intervention(s): Breastfeeding basics reviewed  LATCH Score: 7  Lactation Tools Discussed/Used     Consult Status Consult Status: Follow-up Date: 05/28/14    Ave Filter 05/28/2014, 1:53 PM

## 2014-05-28 NOTE — Plan of Care (Signed)
Problem: Phase I Progression Outcomes Goal: Voiding adequately Outcome: Completed/Met Date Met:  05/28/14 Goal: OOB as tolerated unless otherwise ordered Outcome: Completed/Met Date Met:  05/28/14 Goal: Other Phase I Outcomes/Goals Outcome: Not Applicable Date Met:  69/24/93

## 2014-05-28 NOTE — Plan of Care (Signed)
Problem: Phase II Progression Outcomes Goal: Progress activity as tolerated unless otherwise ordered Outcome: Completed/Met Date Met:  05/28/14 Goal: Afebrile, VS remain stable Outcome: Completed/Met Date Met:  05/28/14 Goal: Incision intact & without signs/symptoms of infection Outcome: Completed/Met Date Met:  05/28/14  Problem: Discharge Progression Outcomes Goal: Tolerating diet Outcome: Completed/Met Date Met:  05/28/14

## 2014-05-28 NOTE — Progress Notes (Signed)
Subjective: Postpartum Day 1: Cesarean Delivery Patient reports tolerating PO and no problems voiding, no pain, no flatus yet    Objective: Vital signs in last 24 hours: Temp:  [97.3 F (36.3 C)-99 F (37.2 C)] 98.8 F (37.1 C) (12/03 0600) Pulse Rate:  [48-110] 96 (12/03 0600) Resp:  [10-22] 18 (12/03 0600) BP: (87-121)/(36-71) 105/46 mmHg (12/03 0600) SpO2:  [92 %-100 %] 96 % (12/03 0600)  Physical Exam:  General: alert, cooperative and no distress Lochia: appropriate Uterine Fundus: firm Incision: healing well, no significant drainage DVT Evaluation: No evidence of DVT seen on physical exam.   Recent Labs  05/26/14 0931  HGB 11.7*  HCT 33.1*    Assessment/Plan: Status post Cesarean section. Doing well postoperatively.  Continue current care. Anticipate discharge in AM 12/4.  Lavon Paganini 05/28/2014, 7:45 AM  I have seen and examined this patient and I agree with the above. Serita Grammes CNM 9:09 AM 05/28/2014

## 2014-05-29 MED ORDER — HYDROMORPHONE HCL 2 MG PO TABS: 1.0000 mg | ORAL_TABLET | ORAL | Status: DC | PRN

## 2014-05-29 MED ORDER — IBUPROFEN 600 MG PO TABS: 600.0000 mg | ORAL_TABLET | Freq: Four times a day (QID) | ORAL | Status: DC

## 2014-05-29 NOTE — Discharge Instructions (Signed)

## 2014-05-29 NOTE — Progress Notes (Signed)
Pt refused Abdomen binder

## 2014-05-29 NOTE — Lactation Note (Signed)
This note was copied from the chart of [name]. Lactation Consultation Note  Patient Name: [name] MCEYE'M Date: 05/29/2014 Reason for consult: Follow-up assessment;Infant weight loss;Breast/nipple pain;Other (Comment) (short labial frenulum , and short posterior frenulum )  Baby is 48 hours old, per mom tender nipples both breast , and breast are feeling heavier and fuller. Baby awake and rooting, LC assessed baby's oral cavity , noted a high palate, Short labial frenulum, and a short anterior frenulum. LC assessed breast tissue with moms permission, no breakdown noted, LC assisted with positioning and depth at the breast. After breast massage , and hand expressing, able to soften the areola well before latching, Baby latched with depth and breast compressions. Per mom intermittent discomfort,  But improved with breast compressions and working on depth. Baby fed for 20 mins , with multiply swallows, increased with breast compressions. Consistent pattern maintained.  Baby was released from the breast by mom due to falling asleep after 20- mins of feedings. Mom instructed on use comfort gels after feedings, breast shells 10 -15 mins before feedings, or after comfort gels , except with sleep.  Also instructed on use hand pump.    Maternal Data Has patient been taught Hand Expression?: Yes  Feeding Feeding Type: Breast Fed Length of feed: 20 min  LATCH Score/Interventions Latch: Grasps breast easily, tongue down, lips flanged, rhythmical sucking.  Audible Swallowing: Spontaneous and intermittent  Type of Nipple: Everted at rest and after stimulation  Comfort (Breast/Nipple): Filling, red/small blisters or bruises, mild/mod discomfort  Problem noted: Filling;Mild/Moderate discomfort  Hold (Positioning): Assistance needed to correctly position infant at breast and maintain latch. Intervention(s): Breastfeeding basics reviewed;Support Pillows;Position options;Skin to  skin  LATCH Score: 8  Lactation Tools Discussed/Used Tools: Shells;Pump;Comfort gels Shell Type: Inverted Breast pump type: Manual Pump Review: Setup, frequency, and cleaning Initiated by:: MAI  Date initiated:: 05/29/14   Consult Status Consult Status: Follow-up Date: 05/30/14 (check on sore nipples ) Follow-up type: In-patient    Myer Haff 05/29/2014, 3:03 PM

## 2014-05-29 NOTE — Discharge Summary (Signed)
Physician Obstetric Discharge Summary  Patient ID: Cynthia Tran MRN: [id number] DOB/AGE: 02-21-1986 28 y.o.  Reason for Admission: cesarean section Prenatal Procedures: ultrasound Intrapartum Procedures: cesarean: low cervical, transverse Postpartum Procedures: none Complications-Operative and Postpartum: none  Delivery Note At 9:43 AM a viable female was delivered via C-Section, Low Transverse (Presentation: ;  ).  APGAR: 9, 9; weight 7 lb 0.9 oz (3200 g).   Placenta status: Intact, Manual removal.  Cord: 3 vessels with the following complications: None.  Cord pH:   Anesthesia: Spinal  Episiotomy: None Lacerations: None Est. Blood Loss (mL):    Mom to postpartum.  Baby to Couplet care / Skin to Skin.  Rowan Blase D 05/29/2014, 8:52 AM    APGAR: , ; weight  .    H/H:  Lab Results  Component Value Date/Time   HGB 11.7* 05/26/2014 09:31 AM   HCT 33.1* 05/26/2014 09:31 AM    Brief Hospital Course: Cynthia Tran is a [id number] who underwent cesarean section on 05/27/2014.  Patient had an uncomplicated surgery; for further details of this surgery, please refer to the operative note.  Patient had an uncomplicated postpartum course.  By time of discharge on POD#2/PPD#2, her pain was controlled on oral pain medications; she had appropriate lochia and was ambulating, voiding without difficulty, tolerating regular diet and passing flatus.   She was deemed stable for discharge to home.    Discharge Diagnoses: Term Pregnancy-delivered  Discharge Information: Date: 05/29/2014 Activity: pelvic rest Diet: routine Baby feeding: plans to breastfeed, plans to bottle feed Contraception: no method Medications: Ibuprofen Discharged Condition: good Instructions: refer to practice specific booklet Discharge to: home   Signed: Delbert Harness 05/29/2014, 8:52 AM   I have seen and examined this patient and agree the above  assessment. CRESENZO-DISHMAN,[name] 06/02/2014 9:11 AM

## 2014-05-30 ENCOUNTER — Ambulatory Visit: Payer: Self-pay

## 2014-05-30 LAB — TYPE AND SCREEN
ABO/RH(D): AB POS
Antibody Screen: NEGATIVE
UNIT DIVISION: 0
Unit division: 0

## 2014-05-30 MED ORDER — DOCUSATE SODIUM 100 MG PO CAPS: 100.0000 mg | ORAL_CAPSULE | Freq: Two times a day (BID) | ORAL | Status: AC

## 2014-05-30 MED ORDER — TRAMADOL HCL 50 MG PO TABS: 100.0000 mg | ORAL_TABLET | Freq: Four times a day (QID) | ORAL | Status: DC | PRN

## 2014-05-30 NOTE — Plan of Care (Signed)
Problem: Phase II Progression Outcomes Goal: Other Phase II Outcomes/Goals Outcome: Not Applicable Date Met:  05/30/14     

## 2014-05-30 NOTE — Lactation Note (Signed)
This note was copied from the chart of [place]. Lactation Consultation Note  Visit with mom prior to discharge.  Mom states baby in latching and breastfeeding well on right side but not latching to left.  Baby is sleeping and just took formula per bottle.  Breasts are engorged.  Initiated DEBP and breasts massaged during pumping  Mom obtained 20 mls from right and 5 mls from left.  Ice packs given with instructions to apply to breasts for 20 minutes and then pump again.  Offered mom a [facility] loaner but she prefers to use manual pump.  DEBP recommended to establish and maintain milk supply.  Lactation outpatient services encouraged.  Instructed to pump every 3 hours and supplement with EBM if baby doesn't latch or nurses poorly.  Patient Name: Cynthia Tran BMWUX'L Date: 05/30/2014 Reason for consult: Follow-up assessment;Difficult latch   Maternal Data    Feeding    LATCH Score/Interventions                      Lactation Tools Discussed/Used WIC Program: Yes Pump Review: Setup, frequency, and cleaning;Milk Storage Initiated by:: Deberah Pelton RN, IBCLC Date initiated:: 05/30/14   Consult Status Consult Status: Complete    Ave Filter 05/30/2014, 1:33 PM

## 2014-05-30 NOTE — Plan of Care (Signed)
Problem: Discharge Progression Outcomes Goal: Remove staples per MD order Outcome: Not Applicable Date Met:  49/49/44 Goal: Discharge plan in place and appropriate Outcome: Completed/Met Date Met:  05/30/14 Goal: Other Discharge Outcomes/Goals Outcome: Completed/Met Date Met:  05/30/14

## 2014-05-30 NOTE — Plan of Care (Signed)
Problem: Consults Goal: Postpartum Patient Education (See Patient Education module for education specifics.)  Outcome: Completed/Met Date Met:  05/30/14     

## 2014-05-30 NOTE — Plan of Care (Signed)
Problem: Discharge Progression Outcomes Goal: Barriers To Progression Addressed/Resolved Outcome: Completed/Met Date Met:  05/30/14 Goal: Activity appropriate for discharge plan Outcome: Completed/Met Date Met:  56/25/63 Goal: Complications resolved/controlled Outcome: Completed/Met Date Met:  05/30/14 Goal: Pain controlled with appropriate interventions Outcome: Completed/Met Date Met:  05/30/14 Goal: Afebrile, VS remain stable at discharge Outcome: Completed/Met Date Met:  05/30/14

## 2014-05-30 NOTE — Discharge Summary (Signed)
Obstetric Discharge Summary Reason for Admission: cesarean section- scheduled repeat Prenatal Procedures: none Intrapartum Procedures: cesarean: low cervical, transverse Postpartum Procedures: none Complications-Operative and Postpartum: none HEMOGLOBIN  Date Value Ref Range Status  05/26/2014 11.7* 12.0 - 15.0 g/dL Final   HCT  Date Value Ref Range Status  05/26/2014 33.1* 36.0 - 46.0 % Final    Physical Exam:  General: alert, cooperative, appears stated age, no distress and mildly obese Lochia: appropriate Uterine Fundus: firm Incision: healing well, no significant drainage, no dehiscence, no significant erythema DVT Evaluation: No evidence of DVT seen on physical exam.  Discharge Diagnoses: Term Pregnancy-delivered  Discharge Information: Date: 05/30/2014 Activity: pelvic rest Diet: routine Medications: Ibuprofen, Colace, Iron and Ultram Condition: stable Instructions: refer to practice specific booklet Discharge to: home Follow-up Information    Follow up with [facility] [place]. Schedule an appointment as soon as possible for a visit in 5 weeks.   Specialty:  Obstetrics and Gynecology   Contact information:   [place], [place] [phone]      Newborn Data: Live born female  Birth Weight: 7 lb 0.9 oz (3200 g) APGAR: 9, 9  Home with mother.  Donalda Ewings L 05/30/2014, 8:47 AM   I have seen and examined this patient and I agree with the above. Is breast/bottlefeeding and declines contraception. Serita Grammes CNM 9:23 AM 05/30/2014

## 2014-06-01 NOTE — Progress Notes (Signed)
Post dischage ur review completed.

## 2014-06-05 ENCOUNTER — Encounter (HOSPITAL_COMMUNITY): Payer: Self-pay

## 2014-06-05 ENCOUNTER — Inpatient Hospital Stay (HOSPITAL_COMMUNITY)
Admission: AD | Admit: 2014-06-05 | Discharge: 2014-06-05 | Disposition: A | Discharge status: Home / Self Care | Payer: Medicaid Other | Source: Ambulatory Visit | Attending: Obstetrics and Gynecology | Admitting: Obstetrics and Gynecology

## 2014-06-05 DIAGNOSIS — O135 Gestational [pregnancy-induced] hypertension without significant proteinuria, complicating the puerperium: Secondary | ICD-10-CM

## 2014-06-05 DIAGNOSIS — R030 Elevated blood-pressure reading, without diagnosis of hypertension: Secondary | ICD-10-CM | POA: Insufficient documentation

## 2014-06-05 DIAGNOSIS — O9989 Other specified diseases and conditions complicating pregnancy, childbirth and the puerperium: Secondary | ICD-10-CM | POA: Insufficient documentation

## 2014-06-05 DIAGNOSIS — O139 Gestational [pregnancy-induced] hypertension without significant proteinuria, unspecified trimester: Secondary | ICD-10-CM

## 2014-06-05 LAB — COMPREHENSIVE METABOLIC PANEL
ALT: 39 U/L — ABNORMAL HIGH (ref 0–35)
ANION GAP: 13 (ref 5–15)
AST: 21 U/L (ref 0–37)
Albumin: 3.2 g/dL — ABNORMAL LOW (ref 3.5–5.2)
Alkaline Phosphatase: 128 U/L — ABNORMAL HIGH (ref 39–117)
BUN: 15 mg/dL (ref 6–23)
CALCIUM: 9.5 mg/dL (ref 8.4–10.5)
CO2: 25 mEq/L (ref 19–32)
Chloride: 99 mEq/L (ref 96–112)
Creatinine, Ser: 0.78 mg/dL (ref 0.50–1.10)
GFR calc non Af Amer: >90 mL/min (ref >90)
GLUCOSE: 99 mg/dL (ref 70–99)
Potassium: 3.9 mEq/L (ref 3.7–5.3)
Sodium: 137 mEq/L (ref 137–147)
Total Bilirubin: 0.6 mg/dL (ref 0.3–1.2)
Total Protein: 6.8 g/dL (ref 6.0–8.3)

## 2014-06-05 LAB — CBC
HCT: 34.9 % — ABNORMAL LOW (ref 36.0–46.0)
HEMOGLOBIN: 12.4 g/dL (ref 12.0–15.0)
MCH: 33.6 pg (ref 26.0–34.0)
MCHC: 35.5 g/dL (ref 30.0–36.0)
MCV: 94.6 fL (ref 78.0–100.0)
Platelets: 222 10*3/uL (ref 150–400)
RBC: 3.69 MIL/uL — ABNORMAL LOW (ref 3.87–5.11)
RDW: 12.9 % (ref 11.5–15.5)
WBC: 7.4 10*3/uL (ref 4.0–10.5)

## 2014-06-05 LAB — PROTEIN / CREATININE RATIO, URINE
Creatinine, Urine: 35.95 mg/dL
Protein Creatinine Ratio: 0.43 — ABNORMAL HIGH (ref 0.00–0.15)
TOTAL PROTEIN, URINE: 15.4 mg/dL

## 2014-06-05 NOTE — MAU Note (Signed)
Pt states here for HTN. HH RN took pt's bp and told pt to come for eval. B/P's at home 180/100, 170/100.  Had headache 2 days ago that took a long time to go away. None today.

## 2014-06-05 NOTE — MAU Provider Note (Signed)
History     CSN: [id number]  Arrival date and time: 06/05/14 1704   First Provider Initiated Contact with Patient 06/05/14 1917      No chief complaint on file.  HPI Comments: Cynthia Tran 28 y.o. [id number] presents to MAU for elevated BP at home. The home health nurse visited today and her blood pressures were 180/100 and 170/100. She did not have HTN during pregnancy. She had a headache 2 days ago but nothing since then. She had a C/S on 05/27/14 by Dr Biagio Quint that was uneventful. Her last Motrin 600 mg was at 8 am and BP was taken 4 hours later     Past Medical History  Diagnosis Date  . Preterm labor     contractions, no dilation  . Fibroid   . Dysrhythmia 2015  . Heartburn during pregnancy   . Headache     history of migraines  . Anemia     Past Surgical History  Procedure Laterality Date  . Cesarean section    . Cesarean section N/A 05/27/2014    Procedure: REPEAT CESAREAN SECTION;  Surgeon: Lavonia Drafts, MD;  Location: [facility];  Service: Obstetrics;  Laterality: N/A;    Family History  Problem Relation Age of Onset  . Hypertension Mother   . Diabetes Maternal Grandmother   . Hypertension Maternal Grandmother   . Heart disease Maternal Grandmother     3 heart attacks    History  Substance Use Topics  . Smoking status: Never Smoker   . Smokeless tobacco: Never Used  . Alcohol Use: No     Comment: Occasional     Allergies:  Allergies  Allergen Reactions  . Dilaudid [Hydromorphone Hcl] Other (See Comments)    Causes blistering on face.  Marland Kitchen Percocet [Oxycodone-Acetaminophen] Other (See Comments)    Blisters on face    Prescriptions prior to admission  Medication Sig Dispense Refill Last Dose  . docusate sodium (COLACE) 100 MG capsule Take 1 capsule (100 mg total) by mouth 2 (two) times daily. 60 capsule 2   . ibuprofen (ADVIL,MOTRIN) 600 MG tablet Take 1 tablet (600 mg total) by mouth every 6 (six) hours. 30 tablet 0   . Prenatal  Vit-Fe Fumarate-FA (MULTIVITAMIN-PRENATAL) 27-0.8 MG TABS tablet Take 1 tablet by mouth daily at 12 noon.   05/26/2014 at Unknown time  . traMADol (ULTRAM) 50 MG tablet Take 2 tablets (100 mg total) by mouth every 6 (six) hours as needed. 50 tablet 0     Review of Systems  Constitutional: Negative.   HENT: Negative.   Eyes: Negative.   Respiratory: Negative.   Cardiovascular: Negative.   Gastrointestinal: Negative.   Genitourinary: Negative.   Musculoskeletal: Negative.   Skin: Negative.   Neurological: Negative.   Psychiatric/Behavioral: Negative.    Physical Exam   Blood pressure 143/71, pulse 59, temperature 98.3 F (36.8 C), temperature source Oral, resp. rate 18, height 5' (1.524 m), weight 80.343 kg (177 lb 2 oz), currently breastfeeding.  Physical Exam  Constitutional: She is oriented to person, place, and time. She appears well-developed and well-nourished. No distress.  HENT:  Head: Normocephalic and atraumatic.  Eyes: Conjunctivae are normal. Pupils are equal, round, and reactive to light.  Cardiovascular: Normal rate, regular rhythm and normal heart sounds.   Respiratory: Effort normal and breath sounds normal.  Musculoskeletal: Normal range of motion.  Neurological: She is alert and oriented to person, place, and time.  Skin: Skin is warm and dry.  Psychiatric: She has  a normal mood and affect. Her behavior is normal. Thought content normal.   Results for orders placed or performed during the hospital encounter of 06/05/14 (from the past 24 hour(s))  CBC     Status: Abnormal   Collection Time: 06/05/14  7:31 PM  Result Value Ref Range   WBC 7.4 4.0 - 10.5 K/uL   RBC 3.69 (L) 3.87 - 5.11 MIL/uL   Hemoglobin 12.4 12.0 - 15.0 g/dL   HCT 34.9 (L) 36.0 - 46.0 %   MCV 94.6 78.0 - 100.0 fL   MCH 33.6 26.0 - 34.0 pg   MCHC 35.5 30.0 - 36.0 g/dL   RDW 12.9 11.5 - 15.5 %   Platelets 222 150 - 400 K/uL  Comprehensive metabolic panel     Status: Abnormal   Collection  Time: 06/05/14  7:31 PM  Result Value Ref Range   Sodium 137 137 - 147 mEq/L   Potassium 3.9 3.7 - 5.3 mEq/L   Chloride 99 96 - 112 mEq/L   CO2 25 19 - 32 mEq/L   Glucose, Bld 99 70 - 99 mg/dL   BUN 15 6 - 23 mg/dL   Creatinine, Ser 0.78 0.50 - 1.10 mg/dL   Calcium 9.5 8.4 - 10.5 mg/dL   Total Protein 6.8 6.0 - 8.3 g/dL   Albumin 3.2 (L) 3.5 - 5.2 g/dL   AST 21 0 - 37 U/L   ALT 39 (H) 0 - 35 U/L   Alkaline Phosphatase 128 (H) 39 - 117 U/L   Total Bilirubin 0.6 0.3 - 1.2 mg/dL   GFR calc non Af Amer >90 >90 mL/min   GFR calc Af Amer >90 >90 mL/min   Anion gap 13 5 - 15  Protein / creatinine ratio, urine     Status: Abnormal   Collection Time: 06/05/14  9:00 PM  Result Value Ref Range   Creatinine, Urine 35.95 mg/dL   Total Protein, Urine 15.4 mg/dL   Protein Creatinine Ratio 0.43 (H) 0.00 - 0.15    MAU Course  Procedures  MDM  CBC, CMET, protein/ creatine ratio with cath urine Transferred care to Allie Dimmer, PA at 8 pm  Pt discussed with Dr. Glo Herring and labs and blood pressures reviewed.  MD agrees pt may be discharged to home.  Assessment and Plan  A: Elevated postpartum blood pressure  P: Discharge to home Precautions given.  If symptoms develop or if BP becomes elevated again, pt to return to MAU.  Pt encouraged to hydrate and rest well Keep all follow up appts.   Patient may return to MAU as needed or if her condition were to change or worsen   [name] 06/05/2014, 7:34 PM

## 2014-06-05 NOTE — Discharge Instructions (Signed)

## 2014-07-20 ENCOUNTER — Ambulatory Visit: Payer: Medicaid Other | Admitting: Cardiovascular Disease

## 2014-07-24 ENCOUNTER — Ambulatory Visit (INDEPENDENT_AMBULATORY_CARE_PROVIDER_SITE_OTHER): Payer: Medicaid Other | Admitting: Cardiovascular Disease

## 2014-07-24 ENCOUNTER — Encounter: Payer: Self-pay | Admitting: Cardiovascular Disease

## 2014-07-24 ENCOUNTER — Other Ambulatory Visit: Payer: Self-pay | Admitting: *Deleted

## 2014-07-24 VITALS — BP 122/82 | HR 96 | Ht 60.0 in | Wt 181.0 lb

## 2014-07-24 DIAGNOSIS — Q254 Congenital malformation of aorta unspecified: Secondary | ICD-10-CM

## 2014-07-24 DIAGNOSIS — I493 Ventricular premature depolarization: Secondary | ICD-10-CM

## 2014-07-24 NOTE — Progress Notes (Signed)
History of Present Illness: 29 yo female history PVCs here today for cardiac follow up. I met her as a new patient 02/12/14. She was 6 months pregnant at the first visit and c/o "hard heartbeats" but no tachycardia or palpitations. No chest pain or SOB. PVCs were noted on her EKG. Echo 02/16/14 with normal LV size and function. Linear density seen in the aorta, likely benign finding. Potassium was low. She was started on KDur. TSH was within normal limits.   She is here today for follow up. She is feeling well. No palpitations. Symptoms resolved.   Primary Care Physician: [facility]  Past Medical History  Diagnosis Date  . Preterm labor     contractions, no dilation  . Fibroid   . Dysrhythmia 2015  . Heartburn during pregnancy   . Headache     history of migraines  . Anemia     Past Surgical History  Procedure Laterality Date  . Cesarean section    . Cesarean section N/A 05/27/2014    Procedure: REPEAT CESAREAN SECTION;  Surgeon: Lavonia Drafts, MD;  Location: [facility];  Service: Obstetrics;  Laterality: N/A;    Current Outpatient Prescriptions  Medication Sig Dispense Refill  . docusate sodium (COLACE) 100 MG capsule Take 1 capsule (100 mg total) by mouth 2 (two) times daily. 60 capsule 2  . Prenatal Vit-Fe Fumarate-FA (PNV PRENATAL PLUS MULTIVITAMIN) 27-1 MG TABS Take 1 tablet by mouth daily.  0   No current facility-administered medications for this visit.    Allergies  Allergen Reactions  . Dilaudid [Hydromorphone Hcl] Other (See Comments)    Causes blistering on face.  . Other Itching and Swelling    Patient cannot bite into fresh fruits(apples, pears, peaches). Causes itching and swelling of the gums.  . Peanuts [Peanut Oil] Swelling    Swelling is of the gums.  Marland Kitchen Percocet [Oxycodone-Acetaminophen] Other (See Comments)    Blisters on face    History   Social History  . Marital Status: Single    Spouse Name: N/A    Number of  Children: 2  . Years of Education: N/A   Occupational History  . CNA    Social History Main Topics  . Smoking status: Never Smoker   . Smokeless tobacco: Never Used  . Alcohol Use: No     Comment: Occasional   . Drug Use: No     Comment: stopped with +preg  . Sexual Activity: Yes    Birth Control/ Protection: Condom   Other Topics Concern  . Not on file   Social History Narrative    Family History  Problem Relation Age of Onset  . Hypertension Mother   . Diabetes Maternal Grandmother   . Hypertension Maternal Grandmother   . Heart disease Maternal Grandmother     3 heart attacks    Review of Systems:  As stated in the HPI and otherwise negative.   BP 122/82 mmHg  Pulse 96  Ht 5' (1.524 m)  Wt 181 lb (82.101 kg)  BMI 35.35 kg/m2  SpO2 98%  Physical Examination: General: Well developed, well nourished, NAD HEENT: OP clear, mucus membranes moist SKIN: warm, dry. No rashes. Neuro: No focal deficits Musculoskeletal: Muscle strength 5/5 all ext Psychiatric: Mood and affect normal Neck: No JVD, no carotid bruits, no thyromegaly, no lymphadenopathy. Lungs:Clear bilaterally, no wheezes, rhonci, crackles Cardiovascular: Regular rate and rhythm. No murmurs, gallops or rubs. Abdomen:Soft. Bowel sounds present. Non-tender.  Extremities: No lower extremity edema. Pulses are 2 + in the bilateral DP/PT.   Echo 02/16/14: Left ventricle: The cavity size was normal. Wall thickness was normal. Systolic function was normal. The estimated ejection fraction was in the range of 55% to 60%. Wall motion was normal; there were no regional wall motion abnormalities. Doppler parameters are consistent with abnormal left ventricular relaxation (grade 1 diastolic dysfunction). - Left atrium: The atrium was mildly dilated.  Impressions:  - Normal LV function; grade 1 diastolic dysfunction; mild LAE; trace MR and TR; Linear density in aortic arch of uncertain significance; suggest CTA  or MRA to further assess.  Assessment and Plan:   1. PVCs: Resolved.   2. Aortic density noted on echo: Will arrange cardiac MRI to better assess. BMET and pregnancy test today.

## 2014-07-24 NOTE — Patient Instructions (Addendum)
Your physician has requested that you have a cardiac MRI. Cardiac MRI uses a computer to create images of your heart as its beating, producing both still and moving pictures of your heart and major blood vessels. For further information please visit [email or web address]

## 2014-07-25 LAB — BASIC METABOLIC PANEL
BUN: 15 mg/dL (ref 6–23)
CO2: 33 mEq/L — ABNORMAL HIGH (ref 19–32)
Calcium: 9.4 mg/dL (ref 8.4–10.5)
Chloride: 102 mEq/L (ref 96–112)
Creat: 0.77 mg/dL (ref 0.50–1.10)
GLUCOSE: 73 mg/dL (ref 70–99)
Potassium: 4 mEq/L (ref 3.5–5.3)
Sodium: 146 mEq/L — ABNORMAL HIGH (ref 135–145)

## 2014-07-25 LAB — HCG, SERUM, QUALITATIVE: PREG SERUM: NEGATIVE

## 2014-07-28 ENCOUNTER — Encounter: Payer: Self-pay | Admitting: Cardiovascular Disease

## 2014-08-03 ENCOUNTER — Ambulatory Visit (HOSPITAL_COMMUNITY): Admission: RE | Admit: 2014-08-03 | Payer: Medicaid Other | Source: Ambulatory Visit

## 2015-03-04 IMAGING — US US OB COMP +14 WK
1 series · 12 of 28 positions shown · non-contrast
Comparison: none

[Series 1: us ob comp +14 wk · 107 acquisitions, 12 frames shown]
[im 4/107]
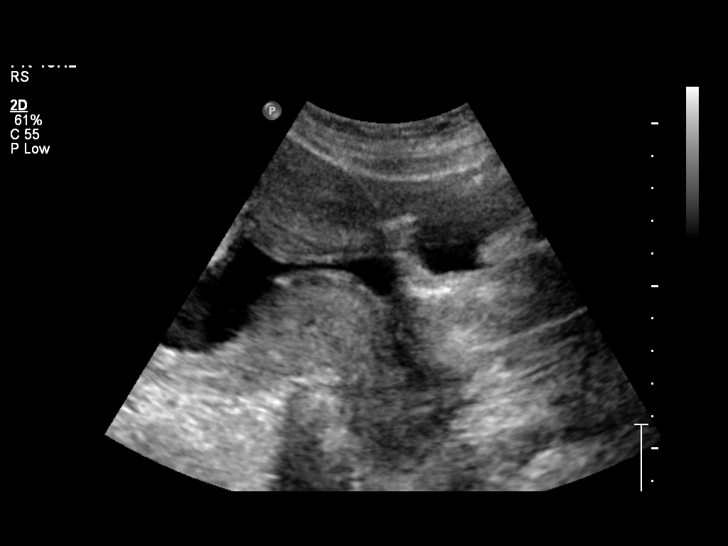
[im 12/107]
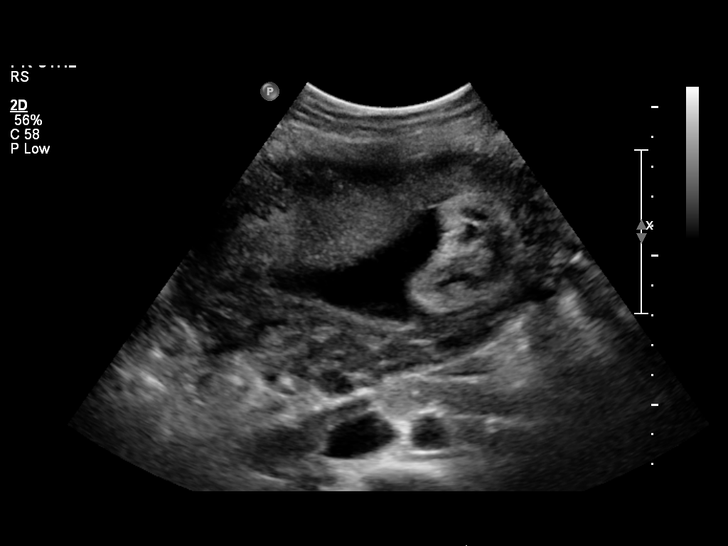
[im 20/107]
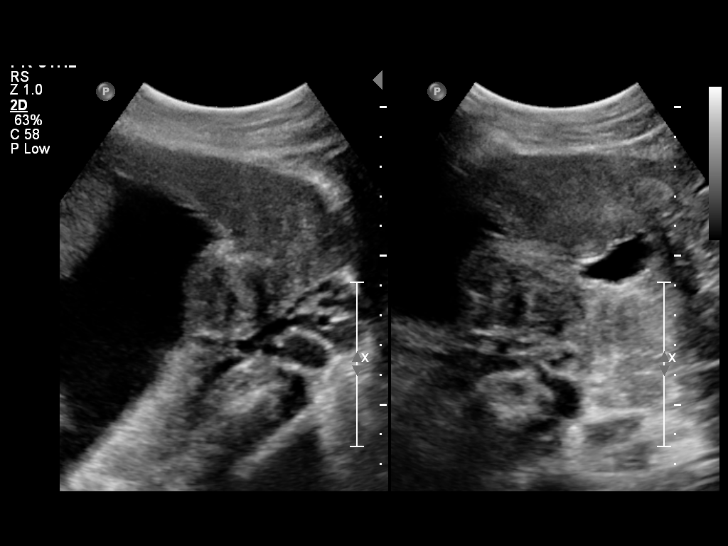
[im 32/107]
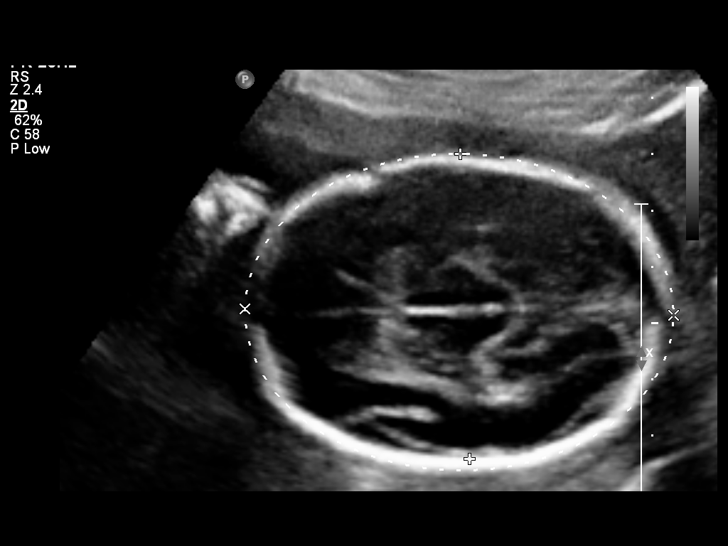
[im 40/107]
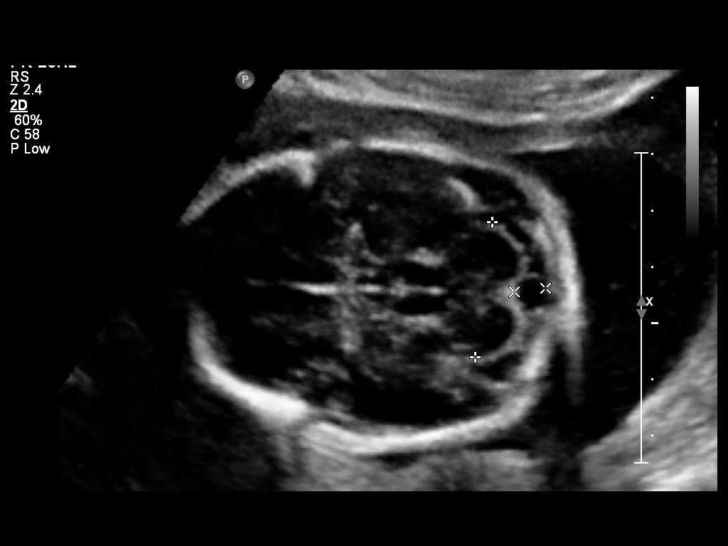
[im 48/107]
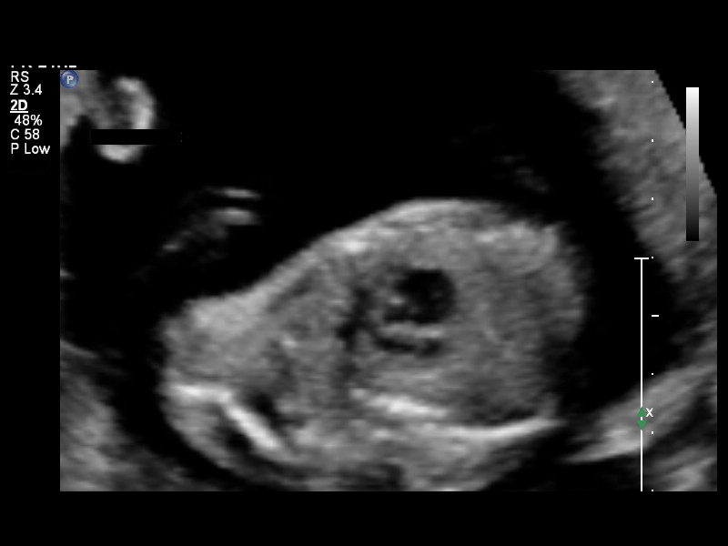
[im 59/107]
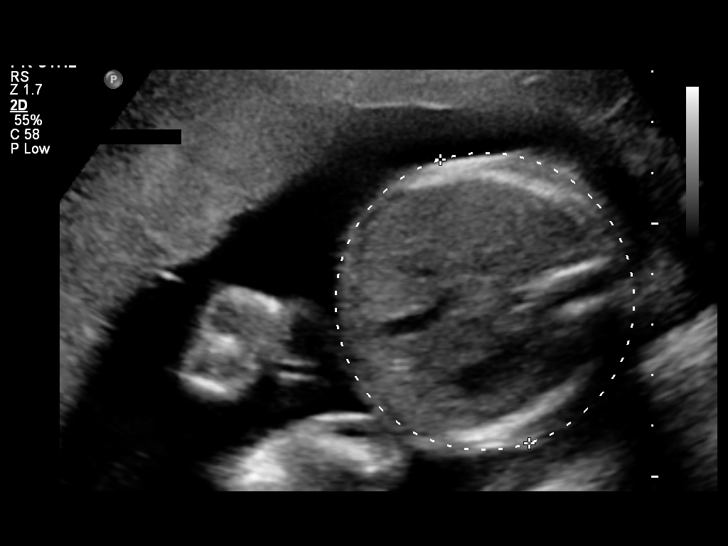
[im 67/107]
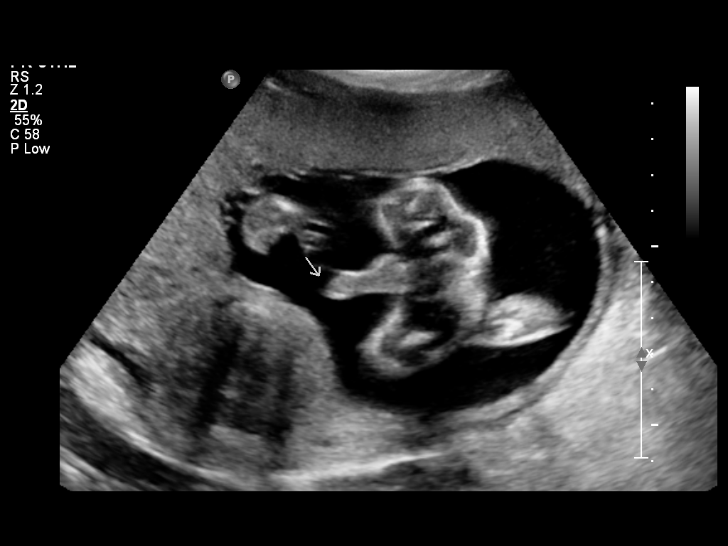
[im 75/107]
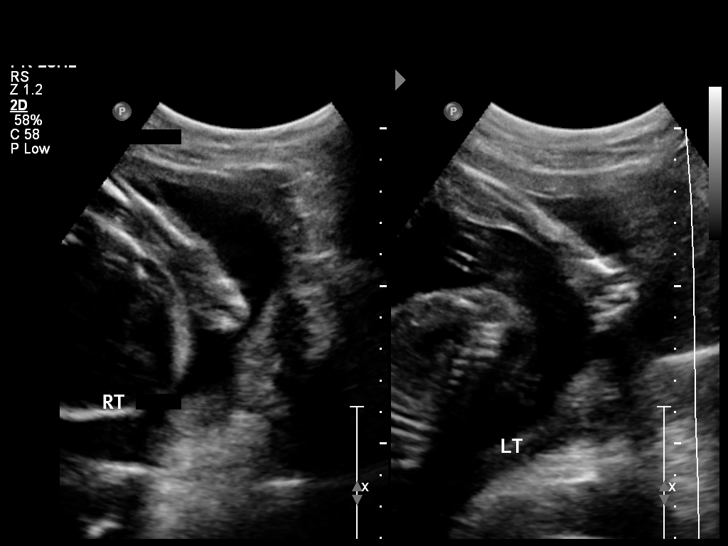
[im 87/107]
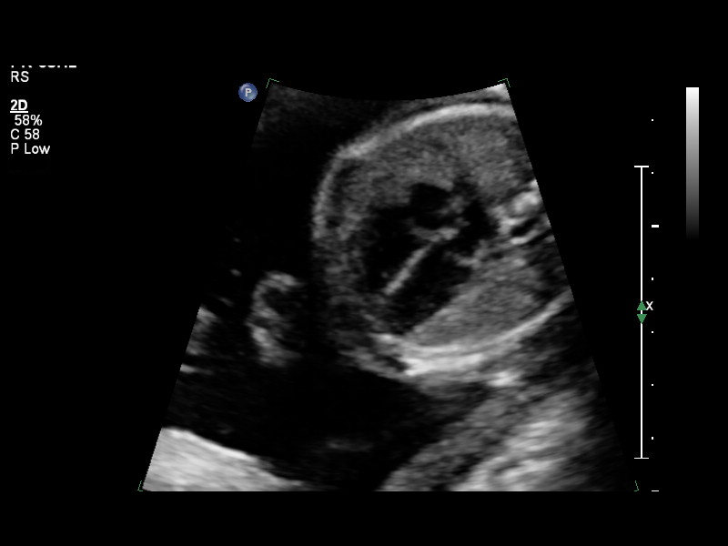
[im 95/107]
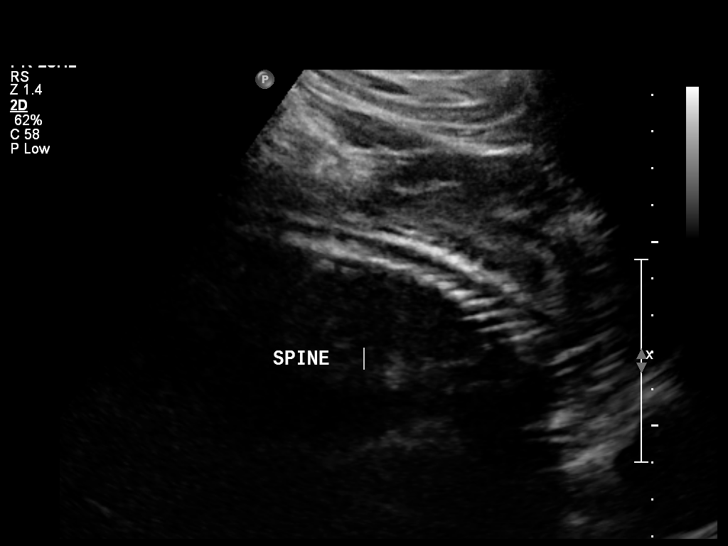
[im 103/107]
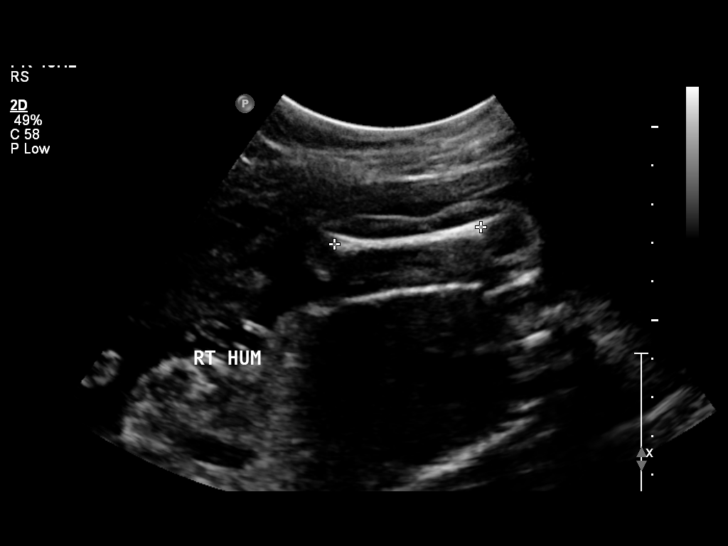

[12 of 28 positions shown; findings below may reference images not displayed]

OBSTETRICS REPORT
                      (Signed Final 02/10/2014 [DATE])

Service(s) Provided

 US OB COMP + 14 WK                                    76805.1
Indications

 Basic anatomic survey
Fetal Evaluation

 Num Of Fetuses:    1
 Preg. Location:    Intrauterine
 Fetal Heart Rate:  150                          bpm
 Cardiac Activity:  Observed
 Presentation:      Cephalic
 Placenta:          Anterior, above cervical os
 P. Cord            Visualized, central
 Insertion:

 Amniotic Fluid
 AFI FV:      Subjectively within normal limits
 AFI Sum:     18.43   cm       73  %Tile     Larg Pckt:    6.48  cm
 RUQ:   6.48    cm   RLQ:    3.4    cm    LUQ:   3.05    cm   LLQ:    5.5    cm
Biometry

 BPD:     54.4  mm     G. Age:  22w 4d                CI:         74.0   70 - 86
 OFD:     73.5  mm                                    FL/HC:      19.4   18.7 -

 HC:     208.6  mm     G. Age:  23w 0d        8  %    HC/AC:      1.16   1.05 -

 AC:     180.2  mm     G. Age:  22w 6d       16  %    FL/BPD:     74.3   71 - 87
 FL:      40.4  mm     G. Age:  23w 0d       16  %    FL/AC:      22.4   20 - 24
 HUM:     38.7  mm     G. Age:  23w 5d       39  %
 CER:     24.1  mm     G. Age:  22w 1d       13  %

 Est. FW:     548  gm      1 lb 3 oz     32  %
Gestational Age

 LMP:           23w 6d        Date:  08/27/13                 EDD:   06/03/14
 Clinical EDD:  23w 6d                                        EDD:   06/03/14
 U/S Today:     22w 6d                                        EDD:   06/10/14
 Best:          23w 6d     Det. By:  LMP  (08/27/13)          EDD:   06/03/14
Anatomy
 Cranium:          Appears normal         Aortic Arch:      Appears normal
 Fetal Cavum:      Appears normal         Ductal Arch:      Appears normal
 Ventricles:       Appears normal         Diaphragm:        Appears normal
 Choroid Plexus:   Appears normal         Stomach:          Appears normal, left
                                                            sided
 Cerebellum:       Appears normal         Abdomen:          Appears normal
 Posterior Fossa:  Appears normal         Abdominal Wall:   Appears nml (cord
                                                            insert, abd wall)
 Nuchal Fold:      Not applicable (>20    Cord Vessels:     Appears normal (3
                   wks GA)                                  vessel cord)
 Face:             Appears normal         Kidneys:          Appear normal
                   (orbits and profile)
 Lips:             Appears normal         Bladder:          Appears normal
 Palate:           Not well visualized    Spine:            Limited views
                                                            appear normal
 Heart:            Appears normal         Lower             Appears normal
                   (4CH, axis, and        Extremities:
                   situs)
 RVOT:             Appears normal         Upper             Appears normal
                                          Extremities:
 LVOT:             Appears normal

 Other:  Fetus appears to be a male. Heels and 5th digit visualized. Nasal
         bone visualized.
Cervix Uterus Adnexa

 Cervical Length:    3.12     cm

 Cervix:       Normal appearance by transabdominal scan.
 Uterus:       Multiple fibroids noted, see table below.
 Cul De Sac:   No free fluid seen.
 Left Ovary:    No adnexal mass visualized.
 Right Ovary:   No adnexal mass visualized.
 Adnexa:     No adnexal mass visualized.
Myomas

 Site                     L(cm)      W(cm)       D(cm)      Location
 Right                    2.6        2.1         2.3        Intramural
 Right                    3.2        2.7         3.8        Intramural
 Posterior                4.3        4.2         4.2        Intramural

 Blood Flow                  RI       PI       Comments
                                               LUS
                                               LUS
                                               RIGHT LATERAL
Impression

 SIUP at 23+6 weeks
 Normal detailed fetal anatomy
 Normal amniotic fluid volume
 Measurements consistent with LMP dating
 Fibroid uterus: see above for size and location
Recommendations
 Follow-up as clinically indicated

## 2017-01-29 LAB — OB RESULTS CONSOLE GC/CHLAMYDIA
Chlamydia: NEGATIVE
Gonorrhea: NEGATIVE

## 2017-01-29 LAB — OB RESULTS CONSOLE RPR: RPR: NONREACTIVE

## 2017-01-29 LAB — OB RESULTS CONSOLE ABO/RH: RH TYPE: POSITIVE

## 2017-01-29 LAB — OB RESULTS CONSOLE ANTIBODY SCREEN: Antibody Screen: NEGATIVE

## 2017-01-29 LAB — OB RESULTS CONSOLE HIV ANTIBODY (ROUTINE TESTING): HIV: NONREACTIVE

## 2017-01-29 LAB — OB RESULTS CONSOLE HEPATITIS B SURFACE ANTIGEN: HEP B S AG: NEGATIVE

## 2017-01-29 LAB — OB RESULTS CONSOLE RUBELLA ANTIBODY, IGM: Rubella: IMMUNE

## 2017-03-20 ENCOUNTER — Encounter (HOSPITAL_COMMUNITY): Payer: Self-pay

## 2017-04-23 LAB — OB RESULTS CONSOLE GBS: STREP GROUP B AG: POSITIVE

## 2017-05-03 ENCOUNTER — Encounter (HOSPITAL_COMMUNITY): Payer: Self-pay | Admitting: *Deleted

## 2017-05-03 ENCOUNTER — Telehealth (HOSPITAL_COMMUNITY): Payer: Self-pay | Admitting: *Deleted

## 2017-05-03 NOTE — Telephone Encounter (Signed)
Preadmission screen  

## 2017-05-04 ENCOUNTER — Telehealth (HOSPITAL_COMMUNITY): Payer: Self-pay | Admitting: *Deleted

## 2017-05-04 NOTE — Telephone Encounter (Signed)
Preadmission screen  

## 2017-05-07 ENCOUNTER — Encounter (HOSPITAL_COMMUNITY): Payer: Self-pay

## 2017-05-14 NOTE — Patient Instructions (Signed)
[name]  05/14/2017   Your procedure is scheduled on:  05/16/2017  Enter through the Main Entrance of [facility] [place] up the phone at the desk and dial [phone]  Call this number if you have problems the morning of surgery:[phone]  Remember:   Do not eat food:After Midnight.  Do not drink clear liquids: After Midnight.  Take these medicines the morning of surgery with A SIP OF WATER: none   Do not wear jewelry, make-up or nail polish.  Do not wear lotions, powders, or perfumes. Do not wear deodorant.  Do not shave 48 hours prior to surgery.  Do not bring valuables to the hospital.  [facility] is not   responsible for any belongings or valuables brought to the hospital.  Contacts, dentures or bridgework may not be worn into surgery.  Leave suitcase in the car. After surgery it may be brought to your room.  For patients admitted to the hospital, checkout time is 11:00 AM the day of              discharge.    N/A   Please read over the following fact sheets that you were given:   Surgical Site Infection Prevention

## 2017-05-15 ENCOUNTER — Encounter (HOSPITAL_COMMUNITY)
Admission: RE | Admit: 2017-05-15 | Discharge: 2017-05-15 | Disposition: A | Discharge status: Home / Self Care | Payer: Medicaid Other | Source: Ambulatory Visit | Attending: Family Medicine | Admitting: Family Medicine

## 2017-05-15 LAB — CBC
HEMATOCRIT: 33 % — AB (ref 36.0–46.0)
Hemoglobin: 11.5 g/dL — ABNORMAL LOW (ref 12.0–15.0)
MCH: 32.8 pg (ref 26.0–34.0)
MCHC: 34.8 g/dL (ref 30.0–36.0)
MCV: 94 fL (ref 78.0–100.0)
PLATELETS: 158 10*3/uL (ref 150–400)
RBC: 3.51 MIL/uL — ABNORMAL LOW (ref 3.87–5.11)
RDW: 13.4 % (ref 11.5–15.5)
WBC: 8.2 10*3/uL (ref 4.0–10.5)

## 2017-05-15 LAB — TYPE AND SCREEN
ABO/RH(D): AB POS
Antibody Screen: NEGATIVE

## 2017-05-16 ENCOUNTER — Other Ambulatory Visit: Payer: Self-pay

## 2017-05-16 ENCOUNTER — Encounter (HOSPITAL_COMMUNITY): Payer: Self-pay | Admitting: Certified Registered Nurse Anesthetist

## 2017-05-16 ENCOUNTER — Encounter (HOSPITAL_COMMUNITY)
Admission: AD | Disposition: A | Discharge status: Home / Self Care | Payer: Self-pay | Source: Ambulatory Visit | Attending: Family Medicine

## 2017-05-16 ENCOUNTER — Inpatient Hospital Stay (HOSPITAL_COMMUNITY)
Admission: AD | Admit: 2017-05-16 | Discharge: 2017-05-19 | DRG: 785 | Disposition: A | Discharge status: Home / Self Care | Payer: Medicaid Other | Source: Ambulatory Visit | Attending: Family Medicine | Admitting: Family Medicine

## 2017-05-16 ENCOUNTER — Inpatient Hospital Stay (HOSPITAL_COMMUNITY): Payer: Medicaid Other | Admitting: Certified Registered Nurse Anesthetist

## 2017-05-16 DIAGNOSIS — O99824 Streptococcus B carrier state complicating childbirth: Secondary | ICD-10-CM | POA: Diagnosis present

## 2017-05-16 DIAGNOSIS — E669 Obesity, unspecified: Secondary | ICD-10-CM | POA: Diagnosis present

## 2017-05-16 DIAGNOSIS — Z302 Encounter for sterilization: Secondary | ICD-10-CM

## 2017-05-16 DIAGNOSIS — Z3A40 40 weeks gestation of pregnancy: Secondary | ICD-10-CM

## 2017-05-16 DIAGNOSIS — O99214 Obesity complicating childbirth: Secondary | ICD-10-CM | POA: Diagnosis present

## 2017-05-16 DIAGNOSIS — Z98891 History of uterine scar from previous surgery: Secondary | ICD-10-CM

## 2017-05-16 DIAGNOSIS — D649 Anemia, unspecified: Secondary | ICD-10-CM | POA: Diagnosis present

## 2017-05-16 DIAGNOSIS — O34211 Maternal care for low transverse scar from previous cesarean delivery: Secondary | ICD-10-CM | POA: Diagnosis not present

## 2017-05-16 DIAGNOSIS — O9902 Anemia complicating childbirth: Secondary | ICD-10-CM | POA: Diagnosis present

## 2017-05-16 HISTORY — PX: TUBAL LIGATION: SHX77

## 2017-05-16 LAB — RPR: RPR Ser Ql: NONREACTIVE

## 2017-05-16 SURGERY — Surgical Case
Anesthesia: Spinal | Site: Abdomen | Wound class: Clean Contaminated

## 2017-05-16 MED ORDER — PRENATAL MULTIVITAMIN CH
1.0000 | ORAL_TABLET | Freq: Every day | ORAL | Status: DC
  Administered 2017-05-17 – 2017-05-19 (×3): 1 via ORAL
  Filled 2017-05-16 (×5): qty 1

## 2017-05-16 MED ORDER — IBUPROFEN 600 MG PO TABS
600.0000 mg | ORAL_TABLET | Freq: Four times a day (QID) | ORAL | Status: DC
  Administered 2017-05-16 – 2017-05-19 (×13): 600 mg via ORAL
  Filled 2017-05-16 (×12): qty 1

## 2017-05-16 MED ORDER — KETOROLAC TROMETHAMINE 30 MG/ML IJ SOLN
30.0000 mg | Freq: Once | INTRAMUSCULAR | Status: AC
  Administered 2017-05-16: 30 mg via INTRAVENOUS

## 2017-05-16 MED ORDER — NALBUPHINE HCL 10 MG/ML IJ SOLN: 5.0000 mg | INTRAMUSCULAR | Status: DC | PRN

## 2017-05-16 MED ORDER — FENTANYL CITRATE (PF) 100 MCG/2ML IJ SOLN: 25.0000 ug | INTRAMUSCULAR | Status: DC | PRN

## 2017-05-16 MED ORDER — TETANUS-DIPHTH-ACELL PERTUSSIS 5-2.5-18.5 LF-MCG/0.5 IM SUSP: 0.5000 mL | Freq: Once | INTRAMUSCULAR | Status: DC

## 2017-05-16 MED ORDER — MORPHINE SULFATE (PF) 0.5 MG/ML IJ SOLN
INTRAMUSCULAR | Status: DC | PRN
Start: 2017-05-16 — End: 2017-05-16
  Administered 2017-05-16: .2 mg via INTRATHECAL

## 2017-05-16 MED ORDER — DIPHENHYDRAMINE HCL 50 MG/ML IJ SOLN: 12.5000 mg | INTRAMUSCULAR | Status: DC | PRN

## 2017-05-16 MED ORDER — ONDANSETRON HCL 4 MG/2ML IJ SOLN
INTRAMUSCULAR | Status: DC | PRN
  Administered 2017-05-16: 4 mg via INTRAVENOUS

## 2017-05-16 MED ORDER — PROMETHAZINE HCL 25 MG/ML IJ SOLN
6.2500 mg | INTRAMUSCULAR | Status: AC | PRN
  Administered 2017-05-16 (×2): 12.5 mg via INTRAVENOUS

## 2017-05-16 MED ORDER — OXYTOCIN 10 UNIT/ML IJ SOLN
INTRAMUSCULAR | Status: AC
  Filled 2017-05-16: qty 4

## 2017-05-16 MED ORDER — SODIUM CHLORIDE 0.9 % IR SOLN
Status: DC | PRN
  Administered 2017-05-16: 1

## 2017-05-16 MED ORDER — ZOLPIDEM TARTRATE 5 MG PO TABS: 5.0000 mg | ORAL_TABLET | Freq: Every evening | ORAL | Status: DC | PRN

## 2017-05-16 MED ORDER — SIMETHICONE 80 MG PO CHEW
80.0000 mg | CHEWABLE_TABLET | ORAL | Status: DC
  Administered 2017-05-16 – 2017-05-18 (×3): 80 mg via ORAL
  Filled 2017-05-16 (×4): qty 1

## 2017-05-16 MED ORDER — ONDANSETRON HCL 4 MG/2ML IJ SOLN: 4.0000 mg | Freq: Three times a day (TID) | INTRAMUSCULAR | Status: DC | PRN

## 2017-05-16 MED ORDER — FENTANYL CITRATE (PF) 100 MCG/2ML IJ SOLN
INTRAMUSCULAR | Status: DC | PRN
  Administered 2017-05-16: 10 ug via INTRATHECAL

## 2017-05-16 MED ORDER — LACTATED RINGERS IV SOLN: INTRAVENOUS | Status: DC

## 2017-05-16 MED ORDER — SIMETHICONE 80 MG PO CHEW
80.0000 mg | CHEWABLE_TABLET | ORAL | Status: DC | PRN
  Filled 2017-05-16: qty 1

## 2017-05-16 MED ORDER — DIBUCAINE 1 % RE OINT: 1.0000 "application " | TOPICAL_OINTMENT | RECTAL | Status: DC | PRN

## 2017-05-16 MED ORDER — PROMETHAZINE HCL 25 MG/ML IJ SOLN
INTRAMUSCULAR | Status: AC
  Filled 2017-05-16: qty 1

## 2017-05-16 MED ORDER — BUPIVACAINE HCL 0.25 % IJ SOLN
INTRAMUSCULAR | Status: DC | PRN
  Administered 2017-05-16: 30 mL

## 2017-05-16 MED ORDER — WITCH HAZEL-GLYCERIN EX PADS: 1.0000 "application " | MEDICATED_PAD | CUTANEOUS | Status: DC | PRN

## 2017-05-16 MED ORDER — SODIUM CHLORIDE 0.9% FLUSH
3.0000 mL | INTRAVENOUS | Status: DC | PRN
Start: 2017-05-16 — End: 2017-05-19

## 2017-05-16 MED ORDER — NALOXONE HCL 0.4 MG/ML IJ SOLN: 0.4000 mg | INTRAMUSCULAR | Status: DC | PRN

## 2017-05-16 MED ORDER — MORPHINE SULFATE (PF) 0.5 MG/ML IJ SOLN
INTRAMUSCULAR | Status: AC
  Filled 2017-05-16: qty 10

## 2017-05-16 MED ORDER — DEXAMETHASONE SODIUM PHOSPHATE 4 MG/ML IJ SOLN
INTRAMUSCULAR | Status: DC | PRN
  Administered 2017-05-16: 4 mg via INTRAVENOUS

## 2017-05-16 MED ORDER — NALBUPHINE HCL 10 MG/ML IJ SOLN: 5.0000 mg | Freq: Once | INTRAMUSCULAR | Status: DC | PRN

## 2017-05-16 MED ORDER — OXYTOCIN 40 UNITS IN LACTATED RINGERS INFUSION - SIMPLE MED: 2.5000 [IU]/h | INTRAVENOUS | Status: AC

## 2017-05-16 MED ORDER — LACTATED RINGERS IV SOLN
INTRAVENOUS | Status: DC | PRN
  Administered 2017-05-16: 10:00:00 via INTRAVENOUS

## 2017-05-16 MED ORDER — ACETAMINOPHEN 325 MG PO TABS
650.0000 mg | ORAL_TABLET | ORAL | Status: DC | PRN
  Administered 2017-05-17 – 2017-05-18 (×3): 650 mg via ORAL
  Filled 2017-05-16 (×4): qty 2

## 2017-05-16 MED ORDER — LACTATED RINGERS IV SOLN
INTRAVENOUS | Status: DC | PRN
  Administered 2017-05-16: 40 [IU] via INTRAVENOUS

## 2017-05-16 MED ORDER — DEXAMETHASONE SODIUM PHOSPHATE 4 MG/ML IJ SOLN
INTRAMUSCULAR | Status: AC
  Filled 2017-05-16: qty 1

## 2017-05-16 MED ORDER — COCONUT OIL OIL
1.0000 "application " | TOPICAL_OIL | Status: DC | PRN
  Administered 2017-05-17: 1 via TOPICAL
  Filled 2017-05-16: qty 120

## 2017-05-16 MED ORDER — FENTANYL CITRATE (PF) 100 MCG/2ML IJ SOLN
INTRAMUSCULAR | Status: AC
  Filled 2017-05-16: qty 2

## 2017-05-16 MED ORDER — PHENYLEPHRINE 8 MG IN D5W 100 ML (0.08MG/ML) PREMIX OPTIME
INJECTION | INTRAVENOUS | Status: DC | PRN
  Administered 2017-05-16: 60 ug/min via INTRAVENOUS

## 2017-05-16 MED ORDER — DEXTROSE 5 % IV SOLN
1.0000 ug/kg/h | INTRAVENOUS | Status: DC | PRN
  Filled 2017-05-16: qty 5

## 2017-05-16 MED ORDER — DIPHENHYDRAMINE HCL 25 MG PO CAPS
25.0000 mg | ORAL_CAPSULE | Freq: Four times a day (QID) | ORAL | Status: DC | PRN
  Filled 2017-05-16: qty 1

## 2017-05-16 MED ORDER — NALBUPHINE HCL 10 MG/ML IJ SOLN
5.0000 mg | Freq: Once | INTRAMUSCULAR | Status: DC | PRN
Start: 2017-05-16 — End: 2017-05-19

## 2017-05-16 MED ORDER — CEFAZOLIN SODIUM-DEXTROSE 2-4 GM/100ML-% IV SOLN
2.0000 g | INTRAVENOUS | Status: AC
  Administered 2017-05-16: 2 g via INTRAVENOUS

## 2017-05-16 MED ORDER — DIPHENHYDRAMINE HCL 25 MG PO CAPS
25.0000 mg | ORAL_CAPSULE | ORAL | Status: DC | PRN
  Filled 2017-05-16: qty 1

## 2017-05-16 MED ORDER — MEPERIDINE HCL 25 MG/ML IJ SOLN: 6.2500 mg | INTRAMUSCULAR | Status: DC | PRN

## 2017-05-16 MED ORDER — MENTHOL 3 MG MT LOZG: 1.0000 | LOZENGE | OROMUCOSAL | Status: DC | PRN

## 2017-05-16 MED ORDER — BUPIVACAINE HCL (PF) 0.25 % IJ SOLN
INTRAMUSCULAR | Status: AC
  Filled 2017-05-16: qty 30

## 2017-05-16 MED ORDER — SENNOSIDES-DOCUSATE SODIUM 8.6-50 MG PO TABS
2.0000 | ORAL_TABLET | ORAL | Status: DC
  Administered 2017-05-16 – 2017-05-17 (×2): 2 via ORAL
  Filled 2017-05-16 (×4): qty 2

## 2017-05-16 MED ORDER — PHENYLEPHRINE 8 MG IN D5W 100 ML (0.08MG/ML) PREMIX OPTIME
INJECTION | INTRAVENOUS | Status: AC
  Filled 2017-05-16: qty 100

## 2017-05-16 MED ORDER — KETOROLAC TROMETHAMINE 30 MG/ML IJ SOLN
INTRAMUSCULAR | Status: AC
  Filled 2017-05-16: qty 1

## 2017-05-16 MED ORDER — BUPIVACAINE IN DEXTROSE 0.75-8.25 % IT SOLN
INTRATHECAL | Status: DC | PRN
  Administered 2017-05-16: 1.6 mL via INTRATHECAL

## 2017-05-16 MED ORDER — ONDANSETRON HCL 4 MG/2ML IJ SOLN
INTRAMUSCULAR | Status: AC
  Filled 2017-05-16: qty 2

## 2017-05-16 MED ORDER — SCOPOLAMINE 1 MG/3DAYS TD PT72
1.0000 | MEDICATED_PATCH | Freq: Once | TRANSDERMAL | Status: AC
  Administered 2017-05-16: 1.5 mg via TRANSDERMAL
  Filled 2017-05-16: qty 1

## 2017-05-16 MED ORDER — SIMETHICONE 80 MG PO CHEW
80.0000 mg | CHEWABLE_TABLET | Freq: Three times a day (TID) | ORAL | Status: DC
  Administered 2017-05-16 – 2017-05-19 (×8): 80 mg via ORAL
  Filled 2017-05-16 (×13): qty 1

## 2017-05-16 SURGICAL SUPPLY — 30 items
APL SKNCLS STERI-STRIP NONHPOA (GAUZE/BANDAGES/DRESSINGS) ×2
BENZOIN TINCTURE PRP APPL 2/3 (GAUZE/BANDAGES/DRESSINGS) ×4 IMPLANT
CHLORAPREP W/TINT 26ML (MISCELLANEOUS) ×4 IMPLANT
CLIP FILSHIE TUBAL LIGA STRL (Clip) ×2 IMPLANT
CLOSURE STERI STRIP 1/2 X4 (GAUZE/BANDAGES/DRESSINGS) ×1 IMPLANT
CLOSURE WOUND 1/2 X4 (GAUZE/BANDAGES/DRESSINGS) ×1
CLOTH BEACON ORANGE TIMEOUT ST (SAFETY) ×4 IMPLANT
DRSG OPSITE POSTOP 4X10 (GAUZE/BANDAGES/DRESSINGS) ×4 IMPLANT
ELECT REM PT RETURN 9FT ADLT (ELECTROSURGICAL) ×4
ELECTRODE REM PT RTRN 9FT ADLT (ELECTROSURGICAL) ×2 IMPLANT
GLOVE BIOGEL PI IND STRL 7.0 (GLOVE) ×6 IMPLANT
GLOVE BIOGEL PI INDICATOR 7.0 (GLOVE) ×6
GLOVE ECLIPSE 6.5 STRL STRAW (GLOVE) ×4 IMPLANT
GOWN STRL REUS W/ TWL LRG LVL3 (GOWN DISPOSABLE) ×4 IMPLANT
GOWN STRL REUS W/TWL LRG LVL3 (GOWN DISPOSABLE) ×8
NEEDLE HYPO 22GX1.5 SAFETY (NEEDLE) ×4 IMPLANT
NS IRRIG 1000ML POUR BTL (IV SOLUTION) ×4 IMPLANT
PAD OB MATERNITY 4.3X12.25 (PERSONAL CARE ITEMS) ×4 IMPLANT
PAD PREP 24X48 CUFFED NSTRL (MISCELLANEOUS) ×4 IMPLANT
RETRACTOR WND ALEXIS 25 LRG (MISCELLANEOUS) IMPLANT
RTRCTR WOUND ALEXIS 25CM LRG (MISCELLANEOUS)
STRIP CLOSURE SKIN 1/2X4 (GAUZE/BANDAGES/DRESSINGS) ×3 IMPLANT
SUT PLAIN 2 0 XLH (SUTURE) ×4 IMPLANT
SUT VIC AB 0 CT1 36 (SUTURE) ×8 IMPLANT
SUT VIC AB 2-0 CT1 27 (SUTURE) ×4
SUT VIC AB 2-0 CT1 TAPERPNT 27 (SUTURE) ×2 IMPLANT
SUT VIC AB 4-0 KS 27 (SUTURE) ×4 IMPLANT
SYR CONTROL 10ML LL (SYRINGE) ×4 IMPLANT
TOWEL OR 17X24 6PK STRL BLUE (TOWEL DISPOSABLE) ×12 IMPLANT
TRAY FOLEY CATH SILVER 16FR (SET/KITS/TRAYS/PACK) ×4 IMPLANT

## 2017-05-16 NOTE — Op Note (Signed)
Operative Report  Cynthia Tran  PROCEDURE DATE: 05/16/2017  PREOPERATIVE DIAGNOSES: Intrauterine pregnancy at [redacted]w[redacted]d weeks gestation; History of prior Cesarean Section x 3;  Undesired fertility  POSTOPERATIVE DIAGNOSES: The same  PROCEDURE: Repeat Low Transverse Cesarean Section; Bilateral Tubal Sterilization using Filshie clips  SURGEON:   Surgeon(s) and Role:     * Caren Macadam, MD - Primary    * Collen Vincent, Jenne Pane, MD - Fellow   INDICATIONS: Cynthia Tran is a 31 y.o. [phone] at [redacted]w[redacted]d here for cesarean section secondary to the indications listed under preoperative diagnoses; please see preoperative note for further details.  The risks of cesarean section were discussed with the patient including but were not limited to: bleeding which may require transfusion or reoperation; infection which may require antibiotics; injury to bowel, bladder, ureters or other surrounding organs; injury to the fetus; need for additional procedures including hysterectomy in the event of a life-threatening hemorrhage; placental abnormalities wth subsequent pregnancies, incisional problems, thromboembolic phenomenon and other postoperative/anesthesia complications.   The patient concurred with the proposed plan, giving informed written consent for the procedure.    FINDINGS:  Viable female infant in cephalic presentation.  Apgars 9 and 9.  Meconium-stained amniotic fluid. Nuchal cord x1. Intact placenta, three vessel cord. Adhesions of fascia, rectus muscles and fascia. Normal uterus, fallopian tubes and ovaries bilaterally.   ANESTHESIA: Spinal INTRAVENOUS FLUIDS: 1000 ml ESTIMATED BLOOD LOSS: 720 ml URINE OUTPUT:  250 ml SPECIMENS: Placenta sent to L&D COMPLICATIONS: None immediate  PROCEDURE IN DETAIL:  The patient preoperatively received intravenous antibiotics and had sequential compression devices applied to her lower extremities.  She was then taken to the operating room where spinal  anesthesia was administered and was found to be adequate. She was then placed in a dorsal supine position with a leftward tilt, and prepped and draped in a sterile manner.  A foley catheter was placed into her bladder and attached to constant gravity.    After an adequate timeout was performed, a Pfannenstiel skin incision was made with scalpel over her preexisting scar and carried through to the underlying layer of fascia. The fascia was incised in the midline, and this incision was extended bilaterally using the Mayo scissors.  Kocher clamps were applied to the superior aspect of the fascial incision and the underlying rectus muscles were dissected off with a combination of blunt and sharp dissection and electrocautery. The fascia, rectus muscles and peritoneum were well-adhered and Metz scissors and electrocautery had to be used to separate the rectus muscles. A bladder flap was created with Truett Mainland scissors. Attention was turned to the lower uterine segment where a low transverse hysterotomy was made with a scalpel and extended bilaterally bluntly.  The infant was successfully delivered, the cord was clamped and cut after one minute, and the infant was handed over to the awaiting neonatology team. Uterine massage was then administered, and the placenta delivered intact with a three-vessel cord. The uterus was then cleared of clots and debris.  The hysterotomy was closed with 0 Vicryl in a running locked fashion, and an imbricating layer was also placed with 0 Vicryl.  Attention was then turned to the fallopian tubes, and Filshie clips were placed about 3 cm from the cornua, with care given to incorporate the underlying mesosalpinx on both sides, allowing for bilateral tubal sterilization. The pelvis was cleared of all clot and debris. Hemostasis was confirmed on all surfaces.  The peritoneum was closed with a 0 Vicryl running stitch.  The fascia was then closed using 0 Vicryl in a running fashion.  The  subcutaneous layer was irrigated, and 30 ml of 0.5% Marcaine was injected subcutaneously around the incision.  The skin was closed with a 4-0 Vicryl subcuticular stitch.   The patient tolerated the procedure well. Sponge, lap, instrument and needle counts were correct x 3.  She was taken to the recovery room in stable condition.   Disposition: PACU - hemodynamically stable.   Maternal Condition: stable   Signed: Gailen Shelter, MD OB Fellow 05/16/2017 11:08 AM

## 2017-05-16 NOTE — Anesthesia Postprocedure Evaluation (Signed)
Anesthesia Post Note  Patient: Cynthia Tran  Procedure(s) Performed: CESAREAN SECTION (N/A Abdomen) BILATERAL TUBAL LIGATION (Bilateral Abdomen)     Patient location during evaluation: Mother Baby Anesthesia Type: Spinal Level of consciousness: awake and alert and oriented Pain management: satisfactory to patient Vital Signs Assessment: post-procedure vital signs reviewed and stable Respiratory status: spontaneous breathing and nonlabored ventilation Cardiovascular status: stable Postop Assessment: no headache, no backache, patient able to bend at knees, no signs of nausea or vomiting and adequate PO intake Anesthetic complications: no    Last Vitals:  Vitals:   05/16/17 1508 05/16/17 1641  BP: (!) 120/45 (!) 122/54  Pulse: 70 73  Resp: 18 16  Temp: 36.7 C 36.8 C  SpO2: 97% 99%    Last Pain:  Vitals:   05/16/17 1641  TempSrc: Oral  PainSc:    Pain Goal:                 Willa Rough

## 2017-05-16 NOTE — Plan of Care (Signed)
  Activity: Ability to tolerate increased activity will improve 05/16/2017 1719 - Progressing by Jackson Latino, RN  Patient tolerated orthostatics and ambulation to bathroom and back to bed WNL. No dizziness, weakness or adverse effects noted.

## 2017-05-16 NOTE — H&P (Signed)
Obstetric Preoperative History and Physical  Cynthia Tran is a 31 y.o. [phone] with IUP at [redacted]w[redacted]d presenting for scheduled cesarean section and BTL  No acute concerns.   Prenatal Course Source of Care: [facility]  Pregnancy complications or risks: Patient Active Problem List   Diagnosis Date Noted  . Delivered by cesarean section 05/29/2014  . PVC (premature ventricular contraction) 02/12/2014   She plans to breastfeed She desires bilateral tubal ligation for postpartum contraception.   Prenatal labs and studies: ABO, Rh: --/--/AB POS (11/20 1025) Antibody: NEG (11/20 1025) Rubella: Immune (08/06 0000) RPR: Non Reactive (11/20 1022)  HBsAg: Negative (08/06 0000)  HIV: Non-reactive (08/06 0000)  OZH:YQMVHQIO (10/29 0000) 1 hr Glucola  125, normal Genetic screening too late Anatomy US normal  Prenatal Transfer Tool  Maternal Diabetes: No Genetic Screening: Declined Maternal Ultrasounds/Referrals: Normal Fetal Ultrasounds or other Referrals:  None Maternal Substance Abuse:  No Significant Maternal Medications:  None Significant Maternal Lab Results: Lab values include: Group B Strep positive  Past Medical History:  Diagnosis Date  . Anemia   . Dysrhythmia 2015  . Fibroid   . Headache    history of migraines  . Heartburn during pregnancy   . Preterm labor    contractions, no dilation    Past Surgical History:  Procedure Laterality Date  . CESAREAN SECTION    . CESAREAN SECTION N/A 05/27/2014   Procedure: REPEAT CESAREAN SECTION;  Surgeon: Lavonia Drafts, MD;  Location: [facility];  Service: Obstetrics;  Laterality: N/A;    OB History  Gravida Para Term Preterm AB Living  4 3 3     3   SAB TAB Ectopic Multiple Live Births        0 3    # Outcome Date GA Lbr Len/2nd Weight Sex Delivery Anes PTL Lv  4 Current           3 Term 05/27/14 [redacted]w[redacted]d  7 lb 0.9 oz (3.2 kg) M CS-LTranv Spinal  LIV  2 Term 11/04/10 [redacted]w[redacted]d  7 lb (3.175 kg) F CS-LTranv   LIV     Birth  Comments: unable to push out baby  1 Term 05/30/07 [redacted]w[redacted]d  6 lb 4 oz (2.835 kg) F CS-LTranv   LIV     Birth Comments: scheduled repeat      Social History   Socioeconomic History  . Marital status: Single    Spouse name: None  . Number of children: 2  . Years of education: None  . Highest education level: None  Social Needs  . Financial resource strain: None  . Food insecurity - worry: None  . Food insecurity - inability: None  . Transportation needs - medical: None  . Transportation needs - non-medical: None  Occupational History  . Occupation: Forensic psychologist: Chief of Staff  Tobacco Use  . Smoking status: Never Smoker  . Smokeless tobacco: Never Used  Substance and Sexual Activity  . Alcohol use: No    Comment: Occasional   . Drug use: No    Comment: stopped with +preg  . Sexual activity: Yes    Birth control/protection: Condom  Other Topics Concern  . None  Social History Narrative  . None    Family History  Problem Relation Age of Onset  . Hypertension Mother   . Diabetes Maternal Grandmother   . Hypertension Maternal Grandmother   . Heart disease Maternal Grandmother        3 heart attacks    Medications  Prior to Admission  Medication Sig Dispense Refill Last Dose  . Prenatal Vit-Fe Phos-FA-Omega (VITAFOL GUMMIES) 3.33-0.333-34.8 MG CHEW CHEW 3 GUMMIES EVERY DAY AS DIRECTED  12 05/15/2017 at Unknown time  . docusate sodium (COLACE) 100 MG capsule Take 1 capsule (100 mg total) by mouth 2 (two) times daily. (Patient not taking: Reported on 05/08/2017) 60 capsule 2 Not Taking at Unknown time    Allergies  Allergen Reactions  . Dilaudid [Hydromorphone Hcl] Other (See Comments)    Causes blistering on face.  . Other Itching and Swelling    Patient cannot bite into fresh fruits(apples, pears, peaches). Causes itching and swelling of the gums.  . Peanuts [Peanut Oil] Swelling    Swelling is of the gums.  Marland Kitchen Percocet [Oxycodone-Acetaminophen] Other  (See Comments)    Blisters on face    Review of Systems: Negative except for what is mentioned in HPI.  Physical Exam: BP (!) 151/95   Pulse (!) 118   Temp 98.2 F (36.8 C) (Oral)   Resp (!) 24   Ht 5' (1.524 m)   Wt 205 lb (93 kg)   BMI 40.04 kg/m  FHR by Doppler: 140 bpm CONSTITUTIONAL: Well-developed, well-nourished female in no acute distress.  HENT:  Normocephalic, atraumatic. Oropharynx is clear and moist EYES: Conjunctivae and EOM are normal. No scleral icterus.  NECK: Normal range of motion, supple SKIN: Skin is warm and dry. No rash noted. Not diaphoretic. No erythema. [place]: Alert and oriented to person, place, and time. Normal reflexes, muscle tone coordination. No cranial nerve deficit noted. PSYCHIATRIC: Normal mood and affect. Normal behavior. CARDIOVASCULAR: Normal heart rate noted, regular rhythm RESPIRATORY: Effort and breath sounds normal, no problems with respiration noted ABDOMEN: Soft, nontender, nondistended, gravid. Well-healed Pfannenstiel incision. PELVIC: Deferred MUSCULOSKELETAL: Normal range of motion. No edema and no tenderness. 2+ distal pulses.   Pertinent Labs/Studies:   Results for orders placed or performed during the hospital encounter of 05/15/17 (from the past 72 hour(s))  CBC     Status: Abnormal   Collection Time: 05/15/17 10:22 AM  Result Value Ref Range   WBC 8.2 4.0 - 10.5 K/uL   RBC 3.51 (L) 3.87 - 5.11 MIL/uL   Hemoglobin 11.5 (L) 12.0 - 15.0 g/dL   HCT 33.0 (L) 36.0 - 46.0 %   MCV 94.0 78.0 - 100.0 fL   MCH 32.8 26.0 - 34.0 pg   MCHC 34.8 30.0 - 36.0 g/dL   RDW 13.4 11.5 - 15.5 %   Platelets 158 150 - 400 K/uL  RPR     Status: None   Collection Time: 05/15/17 10:22 AM  Result Value Ref Range   RPR Ser Ql Non Reactive Non Reactive    Comment: (NOTE) Performed At: [facility] [place], [place] [id number] Rush Farmer MD PJ:[id number]   Type and screen [place]      Status: None   Collection Time: 05/15/17 10:25 AM  Result Value Ref Range   ABO/RH(D) AB POS    Antibody Screen NEG    Sample Expiration 05/18/2017     Assessment and Plan :Cynthia Tran is a 31 y.o. [id number] at [redacted]w[redacted]d being admitted for scheduled cesarean section and BTL The risks of cesarean section discussed with the patient included but were not limited to: bleeding which may require transfusion or reoperation; infection which may require antibiotics; injury to bowel, bladder, ureters or other surrounding organs; injury to the fetus; need for additional procedures including hysterectomy  in the event of a life-threatening hemorrhage; placental abnormalities wth subsequent pregnancies, incisional problems, thromboembolic phenomenon and other postoperative/anesthesia complications.   Patient desires permanent sterilization.  Other reversible forms of contraception were discussed with patient; she declines all other modalities. Risks of procedure discussed with patient including but not limited to: risk of regret, permanence of method, bleeding, infection, injury to surrounding organs and need for additional procedures.  Failure risk of 1-2 % with increased risk of ectopic gestation if pregnancy occurs was also discussed with patient.   The patient concurred with the proposed plan, giving informed written consent for the procedure. Patient has been NPO since last night she will remain NPO for procedure. Anesthesia and OR aware. Preoperative prophylactic antibiotics and SCDs ordered on call to the OR. To OR when ready.    Almyra Free P. Degele, MD OB Fellow Faculty Practice, [facility]

## 2017-05-16 NOTE — Anesthesia Procedure Notes (Signed)
Spinal  Patient location during procedure: OB Start time: 05/16/2017 9:28 AM End time: 05/16/2017 9:33 AM Staffing Anesthesiologist: Lynda Rainwater, MD Performed: anesthesiologist  Preanesthetic Checklist Completed: patient identified, surgical consent, pre-op evaluation, timeout performed, IV checked, risks and benefits discussed and monitors and equipment checked Spinal Block Patient position: sitting Prep: site prepped and draped and DuraPrep Patient monitoring: heart rate, cardiac monitor, continuous pulse ox and blood pressure Approach: midline Location: L3-4 Injection technique: single-shot Needle Needle type: Pencan  Needle gauge: 24 G Needle length: 10 cm Assessment Sensory level: T4

## 2017-05-16 NOTE — Addendum Note (Signed)
Addendum  created 05/16/17 1712 by Jonna Munro, CRNA   Sign clinical note

## 2017-05-16 NOTE — Anesthesia Preprocedure Evaluation (Signed)
Anesthesia Evaluation  Patient identified by MRN, date of birth, ID band Patient awake    Reviewed: Allergy & Precautions, H&P , NPO status , Patient's Chart, lab work & pertinent test results  Airway Mallampati: III  TM Distance: >3 FB Neck ROM: Full    Dental no notable dental hx. (+) Teeth Intact   Pulmonary neg pulmonary ROS,    Pulmonary exam normal breath sounds clear to auscultation       Cardiovascular negative cardio ROS Normal cardiovascular exam+ dysrhythmias  Rhythm:Regular Rate:Normal     Neuro/Psych  Headaches, negative psych ROS   GI/Hepatic Neg liver ROS, GERD  Medicated and Controlled,  Endo/Other  Obesity  Renal/GU negative Renal ROS     Musculoskeletal negative musculoskeletal ROS (+)   Abdominal (+) + obese,   Peds  Hematology  (+) anemia , Mild thrombocytopenia   Anesthesia Other Findings   Reproductive/Obstetrics (+) Pregnancy Previous C/Section x 2 Hx/o PTL                              Anesthesia Physical  Anesthesia Plan  ASA: II  Anesthesia Plan: Spinal   Post-op Pain Management:    Induction:   PONV Risk Score and Plan:   Airway Management Planned: Natural Airway  Additional Equipment:   Intra-op Plan:   Post-operative Plan:   Informed Consent: I have reviewed the patients History and Physical, chart, labs and discussed the procedure including the risks, benefits and alternatives for the proposed anesthesia with the patient or authorized representative who has indicated his/her understanding and acceptance.     Plan Discussed with: Anesthesiologist, CRNA and Surgeon  Anesthesia Plan Comments:         Anesthesia Quick Evaluation

## 2017-05-16 NOTE — Transfer of Care (Signed)
Immediate Anesthesia Transfer of Care Note  Patient: Cynthia Tran  Procedure(s) Performed: CESAREAN SECTION (N/A Abdomen) BILATERAL TUBAL LIGATION (Bilateral Abdomen)  Patient Location: PACU  Anesthesia Type:Spinal  Level of Consciousness: awake, alert  and oriented  Airway & Oxygen Therapy: Patient Spontanous Breathing  Post-op Assessment: Report given to RN and Post -op Vital signs reviewed and stable  Post vital signs: Reviewed and stable  Last Vitals:  Vitals:   05/16/17 0758  BP: (!) 151/95  Pulse: (!) 118  Resp: (!) 24  Temp: 36.8 C    Last Pain:  Vitals:   05/16/17 0758  TempSrc: Oral         Complications: No apparent anesthesia complications

## 2017-05-16 NOTE — Lactation Note (Signed)
This note was copied from a baby's chart. Lactation Consultation Note  Patient Name: Cynthia Tran JMEQA'S Date: 05/16/2017 Reason for consult: Initial assessment;Term   Initial consult at 56 hrs old.  Mom is P4 with experience 6-8 months breastfeeding last 2 children; denies any previous issues with breastfeeding. GA 40.0; BW 7 lbs, 13.6 oz.  C-section; apgars 9 & 9.   Infant has breastfed x2 (10-30 min); voids-2; stools-3; LS-7 by RN. Last feeding was 3.5 hrs ago; infant STS with mom showing very early feeding cues.   Mom demonstrated hand expression with return demonstration with slow but easily appearing colostrum. Mom has large, soft breast with everted nipples.   Mom attempted to latch in cradle hold but infant not opening mouth.   LC showed mom cross-cradle hold with chin tug and breast support.  Infant kept tongue thrusting and sucking on tongue. After suck training, LC was able to get infant to latch for a few sucks but infant would not maintain latch d/t too sleepy.   Infant too sleepy at this time for a feeding. [place] educated on size of infant's stomach, feeding with feeding cues, cluster feeding, and feeding 8-12 times per day.   Lactation brochure given and informed of hospital support group and OP services. Encouraged to call RN for assistance with feedings as needed.      Maternal Data Has patient been taught Hand Expression?: Yes(return demonstration and observation of colostrum slowly but easily appearing) Does the patient have breastfeeding experience prior to this delivery?: Yes  Feeding Feeding Type: Breast Fed Length of feed: (few sucks)  LATCH Score Latch: Too sleepy or reluctant, no latch achieved, no sucking elicited.(few sucks)     Type of Nipple: Everted at rest and after stimulation  Comfort (Breast/Nipple): Soft / non-tender  Hold (Positioning): Assistance needed to correctly position infant at breast and maintain latch.      Interventions Interventions: Breast feeding basics reviewed;Assisted with latch;Support pillows;Skin to skin;Expressed milk;Hand express  Lactation Tools Discussed/Used WIC Program: No   Consult Status Consult Status: Follow-up Follow-up type: In-patient    Merlene Laughter 05/16/2017, 9:43 PM

## 2017-05-16 NOTE — Anesthesia Postprocedure Evaluation (Signed)
Anesthesia Post Note  Patient: Cynthia Tran  Procedure(s) Performed: CESAREAN SECTION (N/A Abdomen) BILATERAL TUBAL LIGATION (Bilateral Abdomen)     Patient location during evaluation: PACU Anesthesia Type: Spinal Level of consciousness: oriented and awake and alert Pain management: pain level controlled Vital Signs Assessment: post-procedure vital signs reviewed and stable Respiratory status: spontaneous breathing and respiratory function stable Cardiovascular status: blood pressure returned to baseline and stable Postop Assessment: no headache, no backache and no apparent nausea or vomiting Anesthetic complications: no    Last Vitals:  Vitals:   05/16/17 1230 05/16/17 1235  BP: 120/71   Pulse: 94 92  Resp: (!) 22 15  Temp:    SpO2: 96% 98%    Last Pain:  Vitals:   05/16/17 1200  TempSrc:   PainSc: 7    Pain Goal:                 Lynda Rainwater

## 2017-05-16 NOTE — Progress Notes (Signed)
Patient eating peanut butter. Has peanut allergy in chart problem list. She and family member states its raw almonds and fresh fruits that she is allergic to, not processed peanut butter. Will continue to monitor.

## 2017-05-17 ENCOUNTER — Encounter (HOSPITAL_COMMUNITY): Payer: Self-pay | Admitting: Family Medicine

## 2017-05-17 LAB — CBC
HCT: 29.1 % — ABNORMAL LOW (ref 36.0–46.0)
Hemoglobin: 10.1 g/dL — ABNORMAL LOW (ref 12.0–15.0)
MCH: 33 pg (ref 26.0–34.0)
MCHC: 34.7 g/dL (ref 30.0–36.0)
MCV: 95.1 fL (ref 78.0–100.0)
PLATELETS: 162 10*3/uL (ref 150–400)
RBC: 3.06 MIL/uL — AB (ref 3.87–5.11)
RDW: 13.4 % (ref 11.5–15.5)
WBC: 9.6 10*3/uL (ref 4.0–10.5)

## 2017-05-17 MED ORDER — TRAMADOL HCL 50 MG PO TABS
50.0000 mg | ORAL_TABLET | Freq: Four times a day (QID) | ORAL | Status: DC | PRN
  Administered 2017-05-17 – 2017-05-18 (×4): 50 mg via ORAL
  Filled 2017-05-17 (×5): qty 1

## 2017-05-17 NOTE — Lactation Note (Signed)
This note was copied from a baby's chart. Lactation Consultation Note  Patient Name: Cynthia Tran TIRWE'R Date: 05/17/2017 Reason for consult: Follow-up assessment;Infant weight loss(7% weight loss )  Per mom the baby has been cluster feedings and nipples feeling tender.  LC assessed with moms permission, and reviewed breast massage, hand express  ( drops noted )  Also showed hand pump and checked flange #24  Good fit and per mom comfortable.  And recommended mom use the breast shells between feedings due to areola edema  But compressible.  Due to 7 % weight - LC plan -  Breast shells between feedings  Prior to latch - breast massage, hand express, pre-pump with hand pump to prime  Milk ducts and reverse pressure. Latch with firm support.     Maternal Data Has patient been taught Hand Expression?: Yes  Feeding Feeding Type: Breast Fed Length of feed: 15 min  LATCH Score ( latch assist by [facility] )  Latch: Grasps breast easily, tongue down, lips flanged, rhythmical sucking.  Audible Swallowing: A few with stimulation  Type of Nipple: Everted at rest and after stimulation  Comfort (Breast/Nipple): Soft / non-tender  Hold (Positioning): Assistance needed to correctly position infant at breast and maintain latch.  LATCH Score: 8  Interventions Interventions: Breast feeding basics reviewed  Lactation Tools Discussed/Used Pump Review: Setup, frequency, and cleaning Initiated by:: MAI  Date initiated:: 05/17/17   Consult Status Consult Status: Follow-up Date: 05/18/17 Follow-up type: In-patient    [place] 05/17/2017, 4:09 PM

## 2017-05-17 NOTE — Progress Notes (Signed)
POSTPARTUM PROGRESS NOTE  Post Operative Day #1 Subjective:  Cynthia Tran is a 31 y.o. [id number] [redacted]w[redacted]d s/p rLTCS/BTL.  No acute events overnight.  Pt denies problems with ambulating, or po intake.  She denies nausea or vomiting.  Pain is well controlled.  She has not had flatus. She has not had bowel movement.  Lochia Minimal.   Objective: Blood pressure 120/62, pulse 77, temperature 98.2 F (36.8 C), temperature source Oral, resp. rate 18, height 5' (1.524 m), weight 205 lb (93 kg), SpO2 95 %, unknown if currently breastfeeding.  Physical Exam:  General: alert, cooperative and no distress Lochia:normal flow Chest: no respiratory distress Heart:regular rate, distal pulses intact Incision: Clean and dry  Abdomen: soft, nontender,  Uterine Fundus: firm, appropriately tender DVT Evaluation: No calf swelling or tenderness Extremities: minimal edema  Recent Labs    05/15/17 1022 05/17/17 0532  HGB 11.5* 10.1*  HCT 33.0* 29.1*    Assessment/Plan:  ASSESSMENT: Cynthia Tran is a 31 y.o. [id number] [redacted]w[redacted]d s/p rLTCS/BTL. Hemoglobin is stable, no dizziness or SOB.  Plan for discharge tomorrow   LOS: 1 day   Marjie Skiff, MD 05/17/2017, 8:49 AM   OB FELLOW POSTPARTUM PROGRESS NOTE ATTESTATION  I have seen and examined this patient and agree with above documentation in the resident's note.   Doing well, other than pain control. Hx of allergy to percocet. Reports taking tramadol with no problems in the past. I have ordered tramadol.  D/c foley   Gailen Shelter, MD OB Fellow 11:54 AM

## 2017-05-18 MED ORDER — TRAMADOL HCL 50 MG PO TABS: 50.0000 mg | ORAL_TABLET | Freq: Four times a day (QID) | ORAL | 0 refills | Status: AC | PRN

## 2017-05-18 MED ORDER — TRAMADOL HCL 50 MG PO TABS: 50.0000 mg | ORAL_TABLET | Freq: Four times a day (QID) | ORAL | 0 refills | Status: DC | PRN

## 2017-05-18 MED ORDER — IBUPROFEN 600 MG PO TABS: 600.0000 mg | ORAL_TABLET | Freq: Four times a day (QID) | ORAL | 0 refills | Status: AC

## 2017-05-18 NOTE — Discharge Instructions (Signed)

## 2017-05-18 NOTE — Lactation Note (Signed)
This note was copied from a baby's chart. Lactation Consultation Note  Patient Name: Cynthia Tran [id number] Date: 05/18/2017 Reason for consult: Follow-up assessment;Nipple pain/trauma;Difficult latch;Term   Follow up with mom of 53 hour old infant. Infant with 10 BF for 10-45 minutes, formula x 1 of 10 cc via bottle, 2 voids and 2 stools in last 24 hours. Infant weight 7 lb 0.7 oz with weight loss of 10% since birth. LATCH scores 8.   Mom reports she is wearing shells and reports she feels infant is latching better. She is having pain both at the beginning of the feeding and throughout the feeding also. She reports infant tends to bite at the breast. Mom reports she is using the cradle and football hold with feeding. Mom reports she is able to hand express more colostrum than before. Mom reports her nipples are sore, not cracked, she is using EBM and coconut oil to nipples.   Discussed BF basics of using pillow support, hand expression, wait for wide open mouth for latch, flanging lips and keeping infant close to breast with feeding. Asked mom to call out for next feeding for latch assistance.   I/O, signs of dehydration in the infant, Engorgement prevention/treatment and breast milk expression and storage reviewed. Mom reports she has a manual pump and has not been pumping. Mom does feel breast shells are helping.   Parents and RN report infant is not to be d/c home today. Will follow up when mom calls.    Maternal Data Has patient been taught Hand Expression?: Yes Does the patient have breastfeeding experience prior to this delivery?: Yes  Feeding    LATCH Score                   Interventions Interventions: Breast feeding basics reviewed;Support pillows;Position options;Expressed milk;Hand express  Lactation Tools Discussed/Used     Consult Status Consult Status: Follow-up Date: 05/19/17 Follow-up type: In-patient    Debby Freiberg Hobart Marte 05/18/2017, 12:50  PM

## 2017-05-18 NOTE — Discharge Summary (Signed)
OB Discharge Summary     Patient Name: Cynthia Tran DOB: 1986-04-05 MRN: [id number]  Date of admission: 05/16/2017 Delivering MD: Lauretta Chester NILES   Date of discharge: 05/18/2017  Admitting diagnosis: RCS Intrauterine pregnancy: [redacted]w[redacted]d     Secondary diagnosis:  Active Problems:   S/P repeat low transverse C-section   Encounter for sterilization  Additional problems: None     Discharge diagnosis: Term Pregnancy Delivered                                                                                                Post partum procedures:postpartum tubal ligation  Augmentation: None  Complications: None  Hospital course:  Sceduled C/S   31 y.o. yo G4P4004 at [redacted]w[redacted]d was admitted to the hospital 05/16/2017 for scheduled cesarean section with the following indication:Prior Uterine Surgery.  Membrane Rupture Time/Date: 10:10 AM ,05/16/2017   Patient delivered a Viable infant.05/16/2017  Details of operation can be found in separate operative note.  Pateint had an uncomplicated postpartum course.  She is ambulating, tolerating a regular diet, passing flatus, and urinating well. Patient is discharged home in stable condition on  05/18/17         Physical exam  Vitals:   05/17/17 0500 05/17/17 1022 05/17/17 1758 05/18/17 0531  BP:  132/72 137/69 124/66  Pulse:  81 84 82  Resp:  (!) 24 17 18   Temp:  98.9 F (37.2 C) 98.2 F (36.8 C) 98.4 F (36.9 C)  TempSrc:  Oral Oral Oral  SpO2: 95% 99%    Weight:      Height:       General: alert, cooperative and no distress Lochia: appropriate Uterine Fundus: firm Incision: Healing well with no significant drainage DVT Evaluation: No evidence of DVT seen on physical exam. Labs: Lab Results  Component Value Date   WBC 9.6 05/17/2017   HGB 10.1 (L) 05/17/2017   HCT 29.1 (L) 05/17/2017   MCV 95.1 05/17/2017   PLT 162 05/17/2017   CMP Latest Ref Rng & Units 07/24/2014  Glucose 70 - 99 mg/dL 73  BUN 6 - 23 mg/dL 15   Creatinine 0.50 - 1.10 mg/dL 0.77  Sodium 135 - 145 mEq/L 146(H)  Potassium 3.5 - 5.3 mEq/L 4.0  Chloride 96 - 112 mEq/L 102  CO2 19 - 32 mEq/L 33(H)  Calcium 8.4 - 10.5 mg/dL 9.4  Total Protein 6.0 - 8.3 g/dL -  Total Bilirubin 0.3 - 1.2 mg/dL -  Alkaline Phos 39 - 117 U/L -  AST 0 - 37 U/L -  ALT 0 - 35 U/L -    Discharge instruction: per After Visit Summary and "Baby and Me Booklet".  After visit meds:  Allergies as of 05/18/2017      Reactions   Dilaudid [hydromorphone Hcl] Other (See Comments)   Causes blistering on face.   Other Itching, Swelling   Patient cannot bite into fresh fruits(apples, pears, peaches). Causes itching and swelling of the gums.   Peanuts [peanut Oil] Swelling   Swelling is of the gums.   Percocet [oxycodone-acetaminophen] Other (See Comments)  Blisters on face      Medication List    TAKE these medications   docusate sodium 100 MG capsule Commonly known as:  COLACE Take 1 capsule (100 mg total) by mouth 2 (two) times daily.   ibuprofen 600 MG tablet Commonly known as:  ADVIL,MOTRIN Take 1 tablet (600 mg total) by mouth every 6 (six) hours for 10 days.   traMADol 50 MG tablet Commonly known as:  ULTRAM Take 1 tablet (50 mg total) by mouth every 6 (six) hours as needed.   VITAFOL GUMMIES 3.33-0.333-34.8 MG Chew CHEW 3 GUMMIES EVERY DAY AS DIRECTED       Diet: routine diet  Activity: Advance as tolerated. Pelvic rest for 6 weeks.   Outpatient follow up:4 weeks Follow up Appt:No future appointments. Follow up Visit:No Follow-up on file.  Postpartum contraception: Tubal Ligation  Newborn Data: Live born female  Birth Weight: 7 lb 13.6 oz (3560 g) APGAR: 56, 9  Newborn Delivery   Birth date/time:  05/16/2017 10:11:00 Delivery type:  C-Section, Low Transverse C-section categorization:  Repeat     Baby Feeding: Breast Disposition:home with mother   05/18/2017 Marjie Skiff, MD

## 2017-05-18 NOTE — Lactation Note (Signed)
This note was copied from a baby's chart. Lactation Consultation Note  Patient Name: Cynthia Tran Date: 05/18/2017 Reason for consult: Follow-up assessment;Nipple pain/trauma;Infant weight loss   Follow up with mom of 24 hour old infant at parents request.   Infant was cueing to feed. Assisted mom in latching infant to the right breast STS in the football hold. Assisted mom with positioning, deepening latch and pillow/head support. Infant initially sleepy at the breast. Reviewed awakening techniques and importance of keeping infant awake at the breast. Mom reports pain with initial latch that improved with feeding. Worked with mom in [place] infant lips also. Infant fed for 20 minutes and self detached, mom's nipple was round when infant came off. Infant with milk around mouth when he came off. Mom burped him and we changed his diaper and we then latched him to the left breast, he fed for about 10 minutes and self detached, infant fell asleep. Nipple again was round. Parents voiced he was much more content that he usually is.   Discussed with mom to begin pumping with DEBP to stimulate milk production and to use mom's milk to supplement infant. Mom hand expressed independently and colostrum easily expressible. Discussed giving all EBM back to infant via spoon, syringe, or bottle.   Parents are concerned infant is not getting enough, we reviewed supplementation amounts via handout. Enc mom to pump post BF for 15 minutes with DEBP on Initiate setting and to follow with hand expression to see if mom's milk is enough to feed infant. Parents report they plan to breast and formula feed at home. Parents report they have been taught LEAD teaching and are aware of the risks of formula. Parents plan to use formula if there is not enough breast milk pumped.   DEBP set up with instructions for use, assembling, disassembling and cleaning of pump parts. Report to Marlyne Beards, RN.   Parents  report they have no further questions/concerns at this time.    Maternal Data Has patient been taught Hand Expression?: Yes Does the patient have breastfeeding experience prior to this delivery?: Yes  Feeding Feeding Type: Breast Fed Length of feed: 40 min  LATCH Score Latch: Repeated attempts needed to sustain latch, nipple held in mouth throughout feeding, stimulation needed to elicit sucking reflex.  Audible Swallowing: Spontaneous and intermittent  Type of Nipple: Everted at rest and after stimulation  Comfort (Breast/Nipple): Filling, red/small blisters or bruises, mild/mod discomfort  Hold (Positioning): Assistance needed to correctly position infant at breast and maintain latch.  LATCH Score: 7  Interventions Interventions: Breast feeding basics reviewed;Support pillows;Assisted with latch;Position options;Skin to skin;Expressed milk;Breast massage;Breast compression;Hand express;Shells;Coconut oil;DEBP  Lactation Tools Discussed/Used Tools: Shells;Pump;Coconut oil Shell Type: Inverted Breast pump type: Double-Electric Breast Pump Pump Review: Setup, frequency, and cleaning;Milk Storage Initiated by:: Nonah Mattes, RN, IBCLC Date initiated:: 05/18/17   Consult Status Consult Status: Follow-up Date: 05/19/17 Follow-up type: In-patient    Cynthia Tran 05/18/2017, 4:13 PM

## 2017-05-18 NOTE — Plan of Care (Signed)
Pt resistive to walking in hall due to incisional pain. Reminded her to walk after receiving pain med and that pain will decrease with increased activity.

## 2017-05-19 ENCOUNTER — Encounter (HOSPITAL_COMMUNITY): Payer: Self-pay

## 2017-05-19 NOTE — Progress Notes (Signed)
Discharge instructions reviewed with patient.  Patient states understanding of home care, medications, activity, signs/symptoms to report to MD and return MD office visit.  Patients significant other and family will assist with her care @ home.  No home  equipment needed, patient has prescriptions and all personal belongings.  Patient will room in and care for baby until baby is discharged.Marland Kitchen

## 2017-05-19 NOTE — Lactation Note (Signed)
This note was copied from a baby's chart. Lactation Consultation Note  Patient Name: Cynthia Tran [id number] Date: 05/19/2017 Reason for consult: Follow-up assessment;Infant weight loss Infant is 30 hours old & seen by Lactation for follow-up assessment. Baby was with mom when [facility] entered and mom reports she had just BF for ~20 mins on her left breast before LC entered. Mom reports that BF is going better but that she is still sore; mom reports she thinks it is healing pain and not new pain. Mom reports she is BF on cue and then supplementing with either her breastmilk or formula after BF. Mom reports she is hand expressing for ~30 mins after BF and last time expressed 64mL. Mom reports she used the DEBP yesterday but reported that she is able to get more milk by hand expressing. Mom reports she has noticed that her breasts are fuller but feels her milk volume has not increased fully yet. Mom reports this happened with her previous babies as well where she would do both breast and formula in the first week and then switch over to only breast. Mom reports she BF her last baby for ~8 months. Baby showed feeding cues so mom tried latching baby to right breast in football hold. After expressing some milk on her nipple, baby opened wide and latched. Mom reported a little discomfort and latch appeared shallow with baby's nose away from the breast. Showed mom how to apply slight pressure to baby's chin to widen his mouth and mom reported more comfort. Also provided more support via pillows and rolled up blanket. Swallows were noted. Baby was still BF when [place] left. Encouraged mom to continue BF at least 8-12x in 24hrs and with feeding cues, hand express or pump after and then supplement with breastmilk and/ or formula. Suggested mom try using the DEBP again today (for 15-20 mins) to see if she has more success since her milk volume is increasing. Mom reports no questions at this time. Encouraged mom to ask for  help as needed.  Maternal Data    Feeding Feeding Type: Breast Fed Length of feed: (still BF when LC left; had been ~15 mins)  LATCH Score Latch: Grasps breast easily, tongue down, lips flanged, rhythmical sucking.  Audible Swallowing: Spontaneous and intermittent  Type of Nipple: Everted at rest and after stimulation  Comfort (Breast/Nipple): Filling, red/small blisters or bruises, mild/mod discomfort  Hold (Positioning): Assistance needed to correctly position infant at breast and maintain latch.  LATCH Score: 8  Interventions Interventions: Breast feeding basics reviewed;Assisted with latch;Breast massage;Hand express;Support pillows;Expressed milk;Shells;DEBP  Lactation Tools Discussed/Used     Consult Status Consult Status: Follow-up Date: 05/20/17 Follow-up type: In-patient    Yvonna Alanis 05/19/2017, 12:40 PM

## 2017-05-19 NOTE — Discharge Summary (Signed)
OB Discharge Summary     Patient Name: Cynthia Tran DOB: 03-21-1986 MRN: [id number] Date of admission: 05/16/2017  Delivering MD: Caren Macadam )  Date of discharge: 05/19/2017    Admitting diagnosis: pregnancy at [redacted] weeks gestation Intrauterine pregnancy: [redacted]w[redacted]d Secondary diagnosis:  Active Problems:   Patient Active Problem List   Diagnosis Date Noted  . S/P repeat low transverse C-section 05/16/2017  . Encounter for sterilization 05/16/2017  . Delivered by cesarean section 05/29/2014  . PVC (premature ventricular contraction) 02/12/2014    Additional problems: none     Discharge diagnosis: Term Pregnancy Delivered                                                                                                Post partum procedures:postpartum tubal ligation  Complications: None  Hospital course:  Sceduled C/S   31 y.o. yo [id number] at Unknown was admitted to the hospital 05/16/2017 for scheduled cesarean section with the following indication:Prior Uterine Surgery.  Membrane Rupture Time/Date: 10:10 AM ,05/16/2017   Patient delivered a Viable infant.05/16/2017  Details of operation can be found in separate operative note.  Pateint had an uncomplicated postpartum course.  She is ambulating, tolerating a regular diet, passing flatus, and urinating well. Patient is discharged home in stable condition on  05/19/17         Physical exam  Vitals:   05/18/17 1900 05/19/17 0500  BP: (!) 152/72 138/86  Pulse: 87 84  Resp: 19 16  Temp: 98.4 F (36.9 C) 98.4 F (36.9 C)  SpO2:      General: alert, cooperative and no distress Lochia: appropriate Uterine Fundus: firm Incision: Healing well with no significant drainage, No significant erythema DVT Evaluation: No evidence of DVT seen on physical exam.  Labs: No results found for this or any previous visit (from the past 24 hour(s)).   Discharge instruction: per After Visit Summary and "Baby and Me  Booklet".  After visit meds:  Allergies  Allergen Reactions  . Dilaudid [Hydromorphone Hcl] Other (See Comments)    Causes blistering on face.  . Other Itching and Swelling    Patient cannot bite into fresh fruits(apples, pears, peaches). Causes itching and swelling of the gums.  . Peanuts [Peanut Oil] Swelling    Swelling is of the gums.  Marland Kitchen Percocet [Oxycodone-Acetaminophen] Other (See Comments)    Blisters on face    Allergies as of 05/19/2017      Reactions   Dilaudid [hydromorphone Hcl] Other (See Comments)   Causes blistering on face.   Other Itching, Swelling   Patient cannot bite into fresh fruits(apples, pears, peaches). Causes itching and swelling of the gums.   Peanuts [peanut Oil] Swelling   Swelling is of the gums.   Percocet [oxycodone-acetaminophen] Other (See Comments)   Blisters on face      Medication List    TAKE these medications   docusate sodium 100 MG capsule Commonly known as:  COLACE Take 1 capsule (100 mg total) by mouth 2 (two) times daily.   ibuprofen 600 MG tablet Commonly known as:  ADVIL,MOTRIN Take 1  tablet (600 mg total) by mouth every 6 (six) hours for 10 days.   traMADol 50 MG tablet Commonly known as:  ULTRAM Take 1 tablet (50 mg total) by mouth every 6 (six) hours as needed.   VITAFOL GUMMIES 3.33-0.333-34.8 MG Chew CHEW 3 GUMMIES EVERY DAY AS DIRECTED        Diet: routine diet  Activity: Advance as tolerated. Pelvic rest for 6 weeks.   Outpatient follow up:1 week for blood pressure check Future Appointments: No future appointments.  Follow up Appt: No Follow-up on file.     Postpartum contraception: Tubal Ligation  Newborn Data: APGAR (1 MIN): 9   APGAR (5 MINS): 9     Baby Feeding: Breast Disposition:home with mother  Dannielle Huh, DO  05/19/2017

## 2017-05-20 ENCOUNTER — Ambulatory Visit: Payer: Self-pay

## 2017-05-20 NOTE — Lactation Note (Signed)
This note was copied from a baby's chart. Lactation Consultation Note: infant had a weight gain of 3 ounces overnight. Mother reports that infant is feeding much better. Mother reports that she last pumped 30 ml of ebm. Mother denies having any nipple tenderness. She reports that her breast are full.  Mother advised to continue to cue base feed infant and at least 8-12 times in 24 hours. Mother is not certified with [facility]. She plans to call. Mother has a harmony hand pump and prefers to hand express over using the hand pump. Discussed treatment and prevention of engorgement. Reviewed S/S of Mastitis. Mother receptive to all teaching . Infant has appt for weight check and bilirubin at Peds in a.m.  Mother is aware of available [place] services. Mother has [facility] phone number to call with any breastfeeding questions or concerns.   Patient Name: Cynthia Tran [id number] Date: 05/20/2017 Reason for consult: Follow-up assessment   Maternal Data    Feeding Feeding Type: Breast Fed Length of feed: 15 min  LATCH Score                   Interventions    Lactation Tools Discussed/Used     Consult Status Consult Status: Complete    Darla Lesches 05/20/2017, 10:14 AM

## 2017-05-24 ENCOUNTER — Encounter (HOSPITAL_COMMUNITY): Payer: Self-pay

## 2020-06-08 ENCOUNTER — Encounter (HOSPITAL_COMMUNITY): Payer: Self-pay

## 2020-06-08 ENCOUNTER — Emergency Department (HOSPITAL_COMMUNITY)
Admission: EM | Admit: 2020-06-08 | Discharge: 2020-06-08 | Disposition: A | Discharge status: Home / Self Care | Payer: Self-pay | Attending: Emergency Medicine | Admitting: Emergency Medicine

## 2020-06-08 ENCOUNTER — Emergency Department (HOSPITAL_COMMUNITY): Payer: Self-pay

## 2020-06-08 ENCOUNTER — Other Ambulatory Visit: Payer: Self-pay

## 2020-06-08 DIAGNOSIS — D259 Leiomyoma of uterus, unspecified: Secondary | ICD-10-CM | POA: Insufficient documentation

## 2020-06-08 DIAGNOSIS — Z91010 Allergy to peanuts: Secondary | ICD-10-CM | POA: Insufficient documentation

## 2020-06-08 LAB — CBC WITH DIFFERENTIAL/PLATELET
Abs Immature Granulocytes: 0.01 10*3/uL (ref 0.00–0.07)
Basophils Absolute: 0 10*3/uL (ref 0.0–0.1)
Basophils Relative: 0 %
Eosinophils Absolute: 0 10*3/uL (ref 0.0–0.5)
Eosinophils Relative: 1 %
HCT: 25.4 % — ABNORMAL LOW (ref 36.0–46.0)
Hemoglobin: 8 g/dL — ABNORMAL LOW (ref 12.0–15.0)
Immature Granulocytes: 0 %
Lymphocytes Relative: 31 %
Lymphs Abs: 1.7 10*3/uL (ref 0.7–4.0)
MCH: 26.5 pg (ref 26.0–34.0)
MCHC: 31.5 g/dL (ref 30.0–36.0)
MCV: 84.1 fL (ref 80.0–100.0)
Monocytes Absolute: 0.3 10*3/uL (ref 0.1–1.0)
Monocytes Relative: 6 %
Neutro Abs: 3.4 10*3/uL (ref 1.7–7.7)
Neutrophils Relative %: 62 %
Platelets: 211 10*3/uL (ref 150–400)
RBC: 3.02 MIL/uL — ABNORMAL LOW (ref 3.87–5.11)
RDW: 17.2 % — ABNORMAL HIGH (ref 11.5–15.5)
WBC: 5.5 10*3/uL (ref 4.0–10.5)
nRBC: 0 % (ref 0.0–0.2)

## 2020-06-08 LAB — COMPREHENSIVE METABOLIC PANEL
ALT: 12 U/L (ref 0–44)
AST: 14 U/L — ABNORMAL LOW (ref 15–41)
Albumin: 4 g/dL (ref 3.5–5.0)
Alkaline Phosphatase: 50 U/L (ref 38–126)
Anion gap: 10 (ref 5–15)
BUN: 8 mg/dL (ref 6–20)
CO2: 23 mmol/L (ref 22–32)
Calcium: 8.8 mg/dL — ABNORMAL LOW (ref 8.9–10.3)
Chloride: 105 mmol/L (ref 98–111)
Creatinine, Ser: 0.71 mg/dL (ref 0.44–1.00)
GFR, Estimated: >60 mL/min (ref >60)
Glucose, Bld: 98 mg/dL (ref 70–99)
Potassium: 3.1 mmol/L — ABNORMAL LOW (ref 3.5–5.1)
Sodium: 138 mmol/L (ref 135–145)
Total Bilirubin: 0.7 mg/dL (ref 0.3–1.2)
Total Protein: 7 g/dL (ref 6.5–8.1)

## 2020-06-08 LAB — URINALYSIS, ROUTINE W REFLEX MICROSCOPIC
Bilirubin Urine: NEGATIVE
Glucose, UA: NEGATIVE mg/dL
Hgb urine dipstick: NEGATIVE
Ketones, ur: NEGATIVE mg/dL
Nitrite: NEGATIVE
Protein, ur: 30 mg/dL — AB
Specific Gravity, Urine: 1.023 (ref 1.005–1.030)
pH: 6 (ref 5.0–8.0)

## 2020-06-08 LAB — I-STAT BETA HCG BLOOD, ED (MC, WL, AP ONLY): I-stat hCG, quantitative: <5 m[IU]/mL (ref <5)

## 2020-06-08 MED ORDER — NAPROXEN 500 MG PO TABS: 500.0000 mg | ORAL_TABLET | Freq: Two times a day (BID) | ORAL | 0 refills | Status: AC

## 2020-06-08 MED ORDER — METHOCARBAMOL 500 MG PO TABS: 500.0000 mg | ORAL_TABLET | Freq: Two times a day (BID) | ORAL | 0 refills | Status: AC

## 2020-06-08 MED ORDER — METHOCARBAMOL 500 MG PO TABS
500.0000 mg | ORAL_TABLET | Freq: Once | ORAL | Status: AC
  Administered 2020-06-08: 09:00:00 500 mg via ORAL
  Filled 2020-06-08: qty 1

## 2020-06-08 NOTE — Discharge Instructions (Signed)
Take medications as needed to help with your pain. You'll ultimately need to follow-up with an OB/GYN specialist. I have provided their information below. Return to the ER if you start to experience worsening pain, abnormal bleeding, chest pain or shortness of breath

## 2020-06-08 NOTE — ED Triage Notes (Signed)
Pt sts left flank/ side pain that has been present all year but became increasingly worse 2 days ago.

## 2020-06-08 NOTE — ED Provider Notes (Signed)
[facility] DEPT Provider Note   CSN: [id number] Arrival date & time: 06/08/20  0100     History Chief Complaint  Patient presents with  . Flank Pain    Cynthia Tran is a 34 y.o. female with a past medical history of anemia, fibroids presenting to the ED with a chief complaint of left flank and left lower quadrant pain.  Symptoms began intermittently approximately 3 years ago after the birth of her child.  She was told initially that it may be due to fibroids or " soreness from my bladder after giving birth."  She noticed for the past 2 days the pain has increased.  Pain is worse with ambulation and improves with rest.  She states that "similar to contraction, it will get worse and then will start to get better by itself."  Earlier in the year she did take over-the-counter pain medication but has since stopped due to no improvement.  She denies any nausea, vomiting, diarrhea, constipation, dysuria, hematuria, fever, chest pain or shortness of breath.  HPI     Past Medical History:  Diagnosis Date  . Anemia   . Dysrhythmia 2015  . Fibroid   . Headache    history of migraines  . Heartburn during pregnancy   . Preterm labor    contractions, no dilation    Patient Active Problem List   Diagnosis Date Noted  . S/P repeat low transverse C-section 05/16/2017  . Encounter for sterilization 05/16/2017  . Delivered by cesarean section 05/29/2014  . PVC (premature ventricular contraction) 02/12/2014    Past Surgical History:  Procedure Laterality Date  . CESAREAN SECTION    . CESAREAN SECTION N/A 05/27/2014   Procedure: REPEAT CESAREAN SECTION;  Surgeon: Lavonia Drafts, MD;  Location: [facility];  Service: Obstetrics;  Laterality: N/A;  . CESAREAN SECTION N/A 05/16/2017   Procedure: CESAREAN SECTION;  Surgeon: Caren Macadam, MD;  Location: [facility];  Service: Obstetrics;  Laterality: N/A;  . TUBAL LIGATION Bilateral  05/16/2017   Procedure: BILATERAL TUBAL LIGATION;  Surgeon: Caren Macadam, MD;  Location: [facility];  Service: Obstetrics;  Laterality: Bilateral;     OB History    Gravida  4   Para  4   Term  4   Preterm      AB      Living  4     SAB      IAB      Ectopic      Multiple  0   Live Births  4           Family History  Problem Relation Age of Onset  . Hypertension Mother   . Diabetes Maternal Grandmother   . Hypertension Maternal Grandmother   . Heart disease Maternal Grandmother        3 heart attacks    Social History   Tobacco Use  . Smoking status: Never Smoker  . Smokeless tobacco: Never Used  Substance Use Topics  . Alcohol use: No    Comment: Occasional   . Drug use: No    Types: Marijuana    Comment: stopped with +preg    Home Medications Prior to Admission medications   Medication Sig Start Date End Date Taking? Authorizing Provider  docusate sodium (COLACE) 100 MG capsule Take 1 capsule (100 mg total) by mouth 2 (two) times daily. Patient not taking: Reported on 05/08/2017 05/30/14   Donalda Ewings, MD  methocarbamol (ROBAXIN) 500  MG tablet Take 1 tablet (500 mg total) by mouth 2 (two) times daily. 06/08/20   Gaylen Venning, PA-C  naproxen (NAPROSYN) 500 MG tablet Take 1 tablet (500 mg total) by mouth 2 (two) times daily. 06/08/20   Claude Swendsen, PA-C  Prenatal Vit-Fe Phos-FA-Omega (VITAFOL GUMMIES) 3.33-0.333-34.8 MG CHEW CHEW 3 GUMMIES EVERY DAY AS DIRECTED 05/01/17   [provider]    Allergies    Dilaudid [hydromorphone hcl], Other, Peanuts [peanut oil], and Percocet [oxycodone-acetaminophen]  Review of Systems   Review of Systems  Constitutional: Negative for appetite change, chills and fever.  HENT: Negative for ear pain, rhinorrhea, sneezing and sore throat.   Eyes: Negative for photophobia and visual disturbance.  Respiratory: Negative for cough, chest tightness, shortness of breath and wheezing.    Cardiovascular: Negative for chest pain and palpitations.  Gastrointestinal: Negative for abdominal pain, blood in stool, constipation, diarrhea, nausea and vomiting.  Genitourinary: Positive for flank pain. Negative for dysuria, hematuria and urgency.  Musculoskeletal: Negative for myalgias.  Skin: Negative for rash.  Neurological: Negative for dizziness, weakness and light-headedness.    Physical Exam Updated Vital Signs BP 129/61   Pulse 96   Temp 98.1 F (36.7 C) (Oral)   Resp (!) 23   Ht 5' (1.524 m)   Wt 75.3 kg   SpO2 100%   BMI 32.42 kg/m   Physical Exam Vitals and nursing note reviewed.  Constitutional:      General: She is not in acute distress.    Appearance: She is well-developed and well-nourished.  HENT:     Head: Normocephalic and atraumatic.     Nose: Nose normal.  Eyes:     General: No scleral icterus.       Right eye: No discharge.        Left eye: No discharge.     Extraocular Movements: EOM normal.     Conjunctiva/sclera: Conjunctivae normal.  Cardiovascular:     Rate and Rhythm: Normal rate and regular rhythm.     Pulses: Intact distal pulses.     Heart sounds: Normal heart sounds. No murmur heard. No friction rub. No gallop.   Pulmonary:     Effort: Pulmonary effort is normal. No respiratory distress.     Breath sounds: Normal breath sounds.  Abdominal:     General: Bowel sounds are normal. There is no distension.     Palpations: Abdomen is soft.     Tenderness: There is no abdominal tenderness. There is no right CVA tenderness, left CVA tenderness or guarding.  Musculoskeletal:        General: No edema. Normal range of motion.     Cervical back: Normal range of motion and neck supple.  Skin:    General: Skin is warm and dry.     Findings: No rash.  Neurological:     Mental Status: She is alert.     Motor: No abnormal muscle tone.     Coordination: Coordination normal.  Psychiatric:        Mood and Affect: Mood and affect normal.      ED Results / Procedures / Treatments   Labs (all labs ordered are listed, but only abnormal results are displayed) Labs Reviewed  URINALYSIS, ROUTINE W REFLEX MICROSCOPIC - Abnormal; Notable for the following components:      Result Value   APPearance HAZY (*)    Protein, ur 30 (*)    Leukocytes,Ua MODERATE (*)    Bacteria, UA RARE (*)  All other components within normal limits  CBC WITH DIFFERENTIAL/PLATELET - Abnormal; Notable for the following components:   RBC 3.02 (*)    Hemoglobin 8.0 (*)    HCT 25.4 (*)    RDW 17.2 (*)    All other components within normal limits  COMPREHENSIVE METABOLIC PANEL - Abnormal; Notable for the following components:   Potassium 3.1 (*)    Calcium 8.8 (*)    AST 14 (*)    All other components within normal limits  I-STAT BETA HCG BLOOD, ED ([facility], [facility], [facility] ONLY)    EKG None  Radiology CT Renal Stone Study  Result Date: 06/08/2020 CLINICAL DATA:  Left flank pain EXAM: CT ABDOMEN AND PELVIS WITHOUT CONTRAST TECHNIQUE: Multidetector CT imaging of the abdomen and pelvis was performed following the standard protocol without IV contrast. COMPARISON:  None. FINDINGS: Lower chest: No acute abnormality. Hepatobiliary: Too small to characterize hypoattenuating lesion of the left lobe. Gallbladder is unremarkable. No biliary dilatation Pancreas: Unremarkable. Spleen: Unremarkable. Adrenals/Urinary Tract: Adrenals are unremarkable. There is no hydronephrosis. No renal or ureteral calculi. Bladder is unremarkable. Stomach/Bowel: Stomach is within normal limits. Bowel is normal in caliber. Vascular/Lymphatic: No significant vascular findings on this noncontrast study no enlarged lymph nodes identified Reproductive: Enlarged, probably myomatous uterus. Bilateral tubal ligation clips. No adnexal mass. Other: No free fluid. Laxity of the ventral abdominal wall above the umbilicus. Musculoskeletal: No acute osseous abnormality. IMPRESSION: No acute abnormality.  Specifically, there is no urinary tract calculus or hydronephrosis. Enlarged, probably myomatous uterus. Electronically Signed   By: Macy Mis M.D.   On: 06/08/2020 08:46    Procedures Procedures (including critical care time)  Medications Ordered in ED Medications  methocarbamol (ROBAXIN) tablet 500 mg (500 mg Oral Given 06/08/20 6283)    ED Course  I have reviewed the triage vital signs and the nursing notes.  Pertinent labs & imaging results that were available during my care of the patient were reviewed by me and considered in my medical decision making (see chart for details).    MDM Rules/Calculators/A&P                          34 year old female with a past medical history of anemia, fibroids presenting to the ED with a chief complaint of left flank and left lower quadrant pain. Symptoms began approximately 3 years ago after the birth of her child but worsened in the past 2 days. Pain is intermittent, worse with ambulation and movement and improves with rest. Has not been taking the medications to help with pain because she states in the past these medications have stopped working. Denies any pelvic complaints, vaginal discharge, abnormal vaginal bleeding. No nausea, vomiting or changes to bowel movements. No chest pain, shortness of breath or urinary symptoms. On exam abdomen is soft, nontender nondistended. There is no CVA tenderness noted bilaterally. Work-up here significant for unremarkable CBC, CMP. hCG is negative. Urinalysis with moderate leukocytes, rare bacteria but does have several squamous cells. CT renal stone study without any concerning findings, there is noted to be a fibroid uterus. I suspect this may be the cause of her ongoing pain for several years. No other structural cause such as a kidney stone. She denies any urinary symptoms and has no CVA tenderness so I doubt [place]. Will treat symptomatically and have her follow-up with OB/GYN. Return precautions  given.   Patient is hemodynamically stable, in NAD, and able to ambulate in the  ED. Evaluation does not show pathology that would require ongoing emergent intervention or inpatient treatment. I explained the diagnosis to the patient. Pain has been managed and has no complaints prior to discharge. Patient is comfortable with above plan and is stable for discharge at this time. All questions were answered prior to disposition. Strict return precautions for returning to the ED were discussed. Encouraged follow up with PCP.   An After Visit Summary was printed and given to the patient.   Portions of this note were generated with [employer]. Dictation errors may occur despite best attempts at proofreading.  Final Clinical Impression(s) / ED Diagnoses Final diagnoses:  Uterine leiomyoma, unspecified location    Rx / DC Orders ED Discharge Orders         Ordered    naproxen (NAPROSYN) 500 MG tablet  2 times daily        06/08/20 0951    methocarbamol (ROBAXIN) 500 MG tablet  2 times daily        06/08/20 0951           Delia Heady, PA-C 06/08/20 2725    Breck Coons, MD 06/08/20 1352

## 2021-06-30 IMAGING — CT CT RENAL STONE PROTOCOL
2 of 4 series · 17 of 46 positions shown, 19 images · non-contrast
Comparison: None.

CLINICAL DATA: Left flank pain

EXAM:
CT ABDOMEN AND PELVIS WITHOUT CONTRAST
TECHNIQUE: Multidetector CT imaging of the abdomen and pelvis was performed
following the standard protocol without IV contrast.

[Series 2: axial st · axial · 0.62mm/px · z∈[+1040,+1400]mm · 14 of 81 slices shown, 16 images]
[im 5/81  soft-tissue]
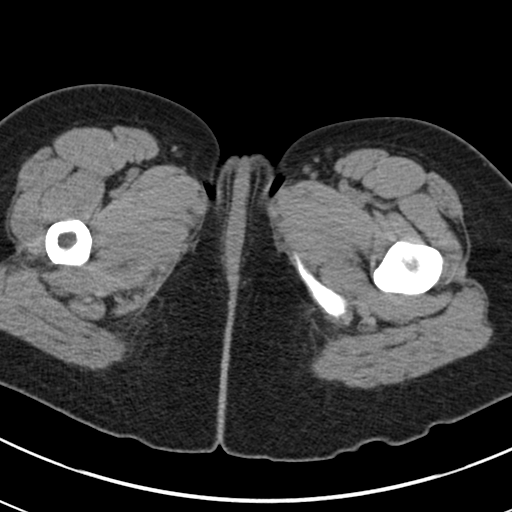
[im 5/81  bone]
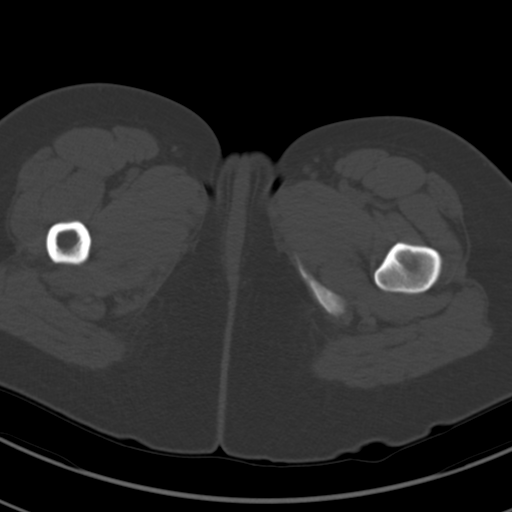
[im 13/81  soft-tissue]
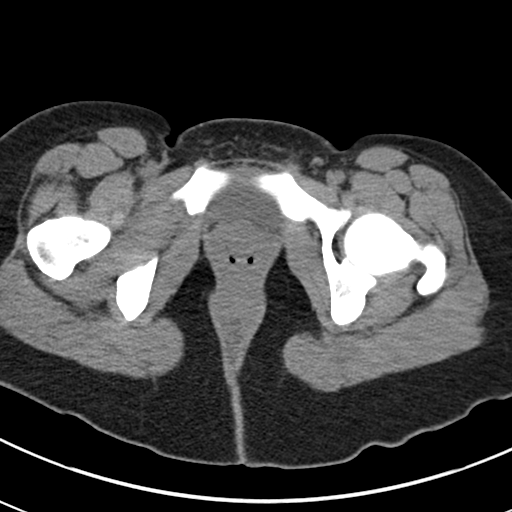
[im 17/81  soft-tissue]
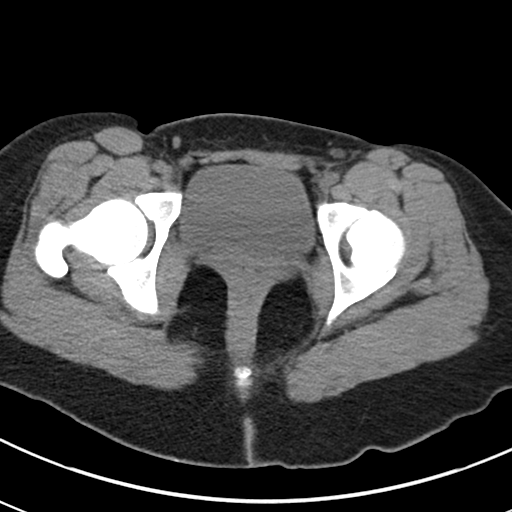
[im 21/81  soft-tissue]
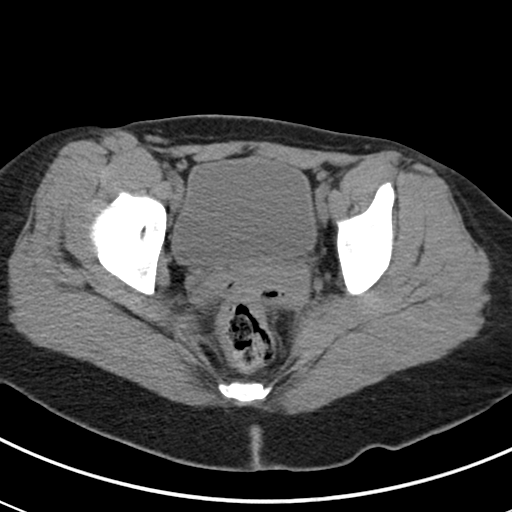
[im 29/81  soft-tissue]
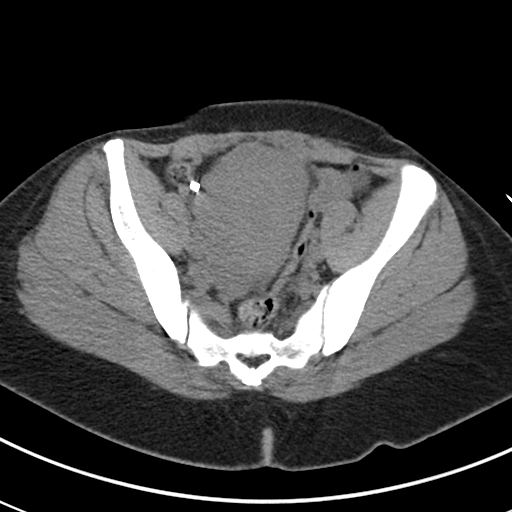
[im 33/81  soft-tissue]
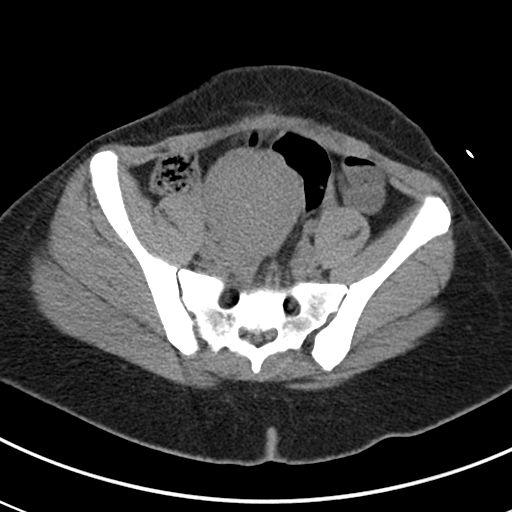
[im 37/81  soft-tissue]
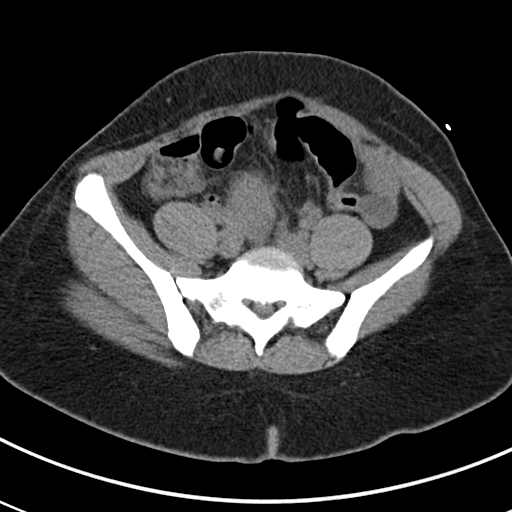
[im 45/81  soft-tissue]
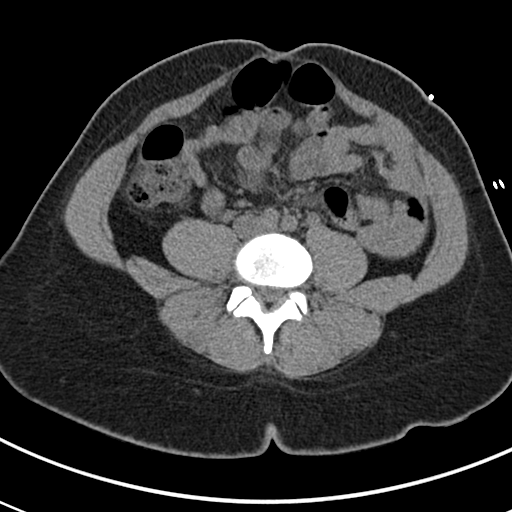
[im 49/81  soft-tissue]
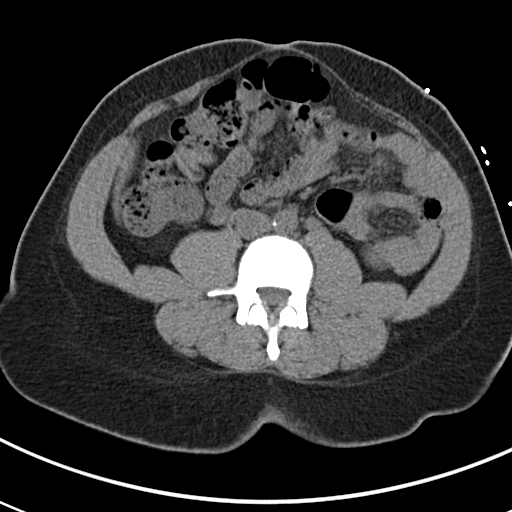
[im 49/81  bone]
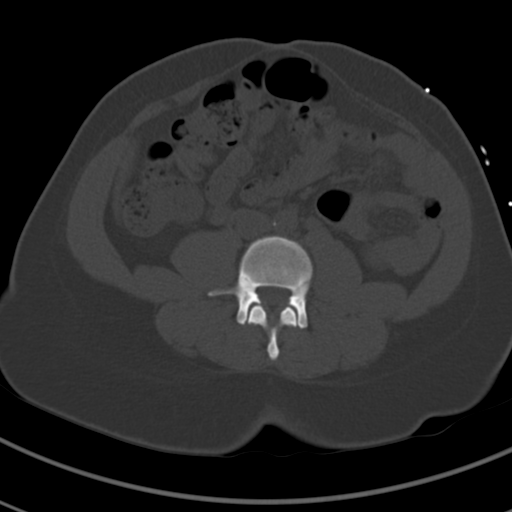
[im 53/81  soft-tissue]
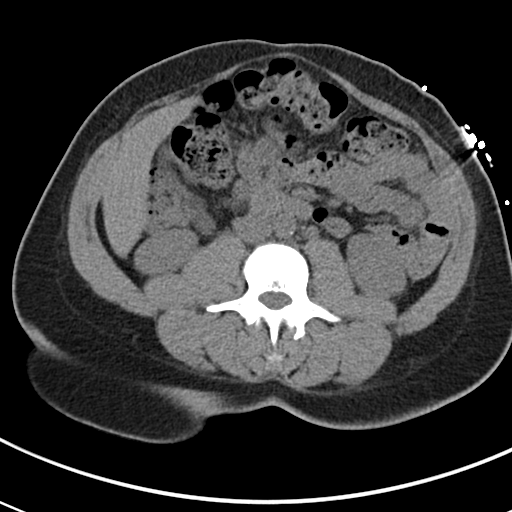
[im 61/81  soft-tissue]
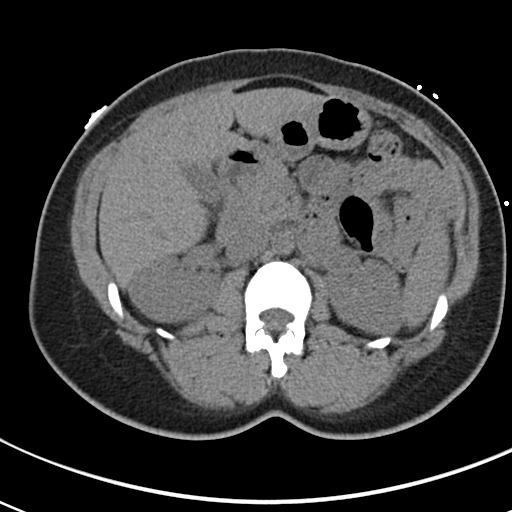
[im 65/81  soft-tissue]
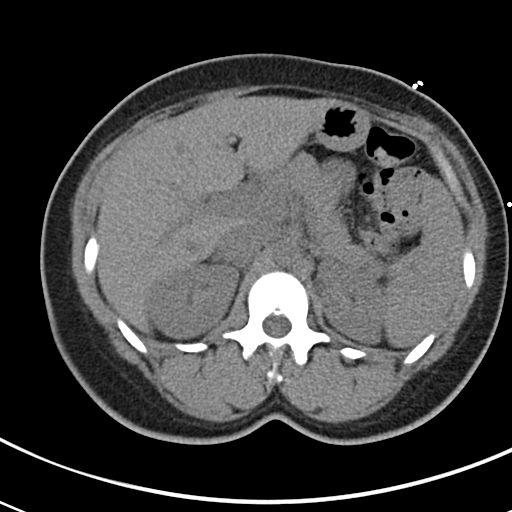
[im 69/81  soft-tissue]
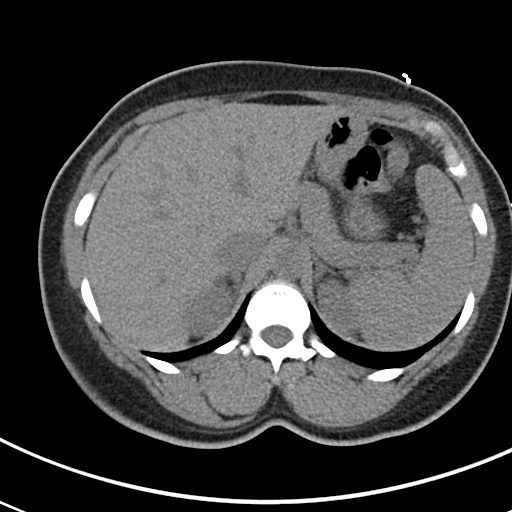
[im 77/81  soft-tissue]
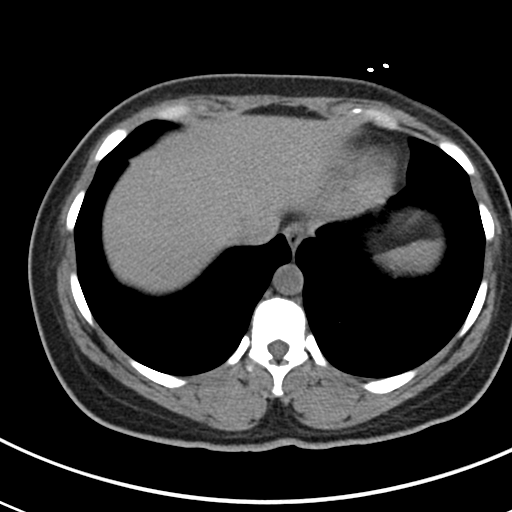

[Series 4: coronal · coronal · 0.72mm/px · 3 of 131 slices shown]
[im 44/131  soft-tissue]
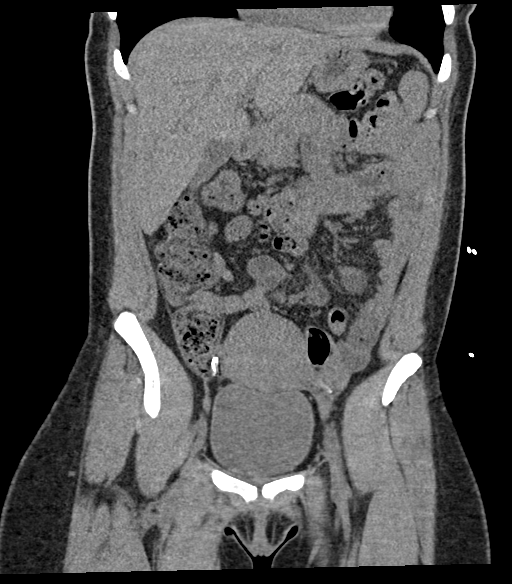
[im 58/131  soft-tissue]
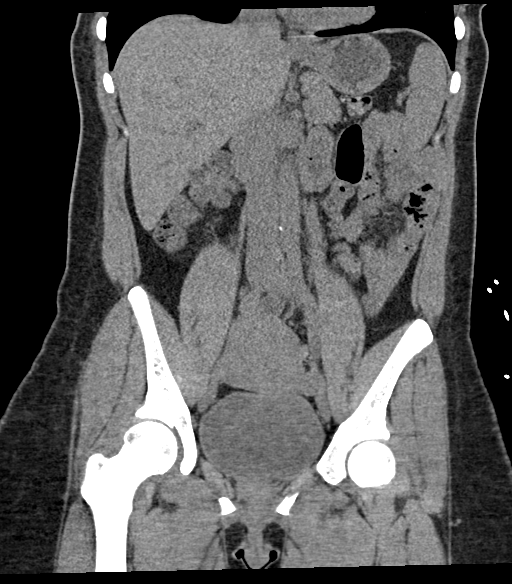
[im 73/131  soft-tissue]
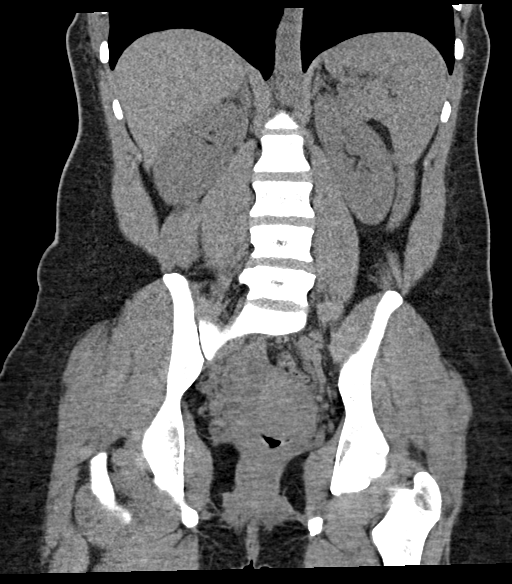

[17 of 46 positions shown; findings below may reference images not displayed]

FINDINGS: Lower chest: No acute abnormality.

Hepatobiliary: Too small to characterize hypoattenuating lesion of
the left lobe. Gallbladder is unremarkable. No biliary dilatation

Pancreas: Unremarkable.

Spleen: Unremarkable.

Adrenals/Urinary Tract: Adrenals are unremarkable. There is no
hydronephrosis. No renal or ureteral calculi. Bladder is
unremarkable.

Stomach/Bowel: Stomach is within normal limits. Bowel is normal in
caliber.

Vascular/Lymphatic: No significant vascular findings on this
noncontrast study no enlarged lymph nodes identified

Reproductive: Enlarged, probably myomatous uterus. Bilateral tubal
ligation clips. No adnexal mass.

Other: No free fluid. Laxity of the ventral abdominal wall above the
umbilicus.

Musculoskeletal: No acute osseous abnormality.
IMPRESSION: No acute abnormality. Specifically, there is no urinary tract
calculus or hydronephrosis.

Enlarged, probably myomatous uterus.
